# Patient Record
Sex: Female | Born: 1975
Health system: Southern US, Community
[De-identification: ages and names within clinical notes are randomized; demographics above are authoritative.]

## PROBLEM LIST (undated history)

## (undated) ENCOUNTER — Inpatient Hospital Stay (HOSPITAL_COMMUNITY): Payer: Self-pay

## (undated) DIAGNOSIS — O24419 Gestational diabetes mellitus in pregnancy, unspecified control: Secondary | ICD-10-CM

---

## 2000-05-01 ENCOUNTER — Encounter: Payer: Self-pay | Admitting: Emergency Medicine

## 2000-05-01 ENCOUNTER — Emergency Department (HOSPITAL_COMMUNITY): Admission: EM | Admit: 2000-05-01 | Discharge: 2000-05-01 | Payer: Self-pay | Admitting: Emergency Medicine

## 2000-06-10 ENCOUNTER — Emergency Department (HOSPITAL_COMMUNITY): Admission: EM | Admit: 2000-06-10 | Discharge: 2000-06-10 | Payer: Self-pay | Admitting: Emergency Medicine

## 2005-01-13 ENCOUNTER — Inpatient Hospital Stay (HOSPITAL_COMMUNITY): Admission: AD | Admit: 2005-01-13 | Discharge: 2005-01-13 | Payer: Self-pay | Admitting: *Deleted

## 2005-01-23 ENCOUNTER — Inpatient Hospital Stay (HOSPITAL_COMMUNITY): Admission: AD | Admit: 2005-01-23 | Discharge: 2005-01-23 | Payer: Self-pay | Admitting: *Deleted

## 2005-01-27 ENCOUNTER — Inpatient Hospital Stay (HOSPITAL_COMMUNITY): Admission: AD | Admit: 2005-01-27 | Discharge: 2005-01-27 | Payer: Self-pay | Admitting: *Deleted

## 2005-03-18 ENCOUNTER — Ambulatory Visit (HOSPITAL_COMMUNITY): Admission: RE | Admit: 2005-03-18 | Discharge: 2005-03-18 | Payer: Self-pay | Admitting: *Deleted

## 2005-03-27 ENCOUNTER — Ambulatory Visit (HOSPITAL_COMMUNITY): Admission: RE | Admit: 2005-03-27 | Discharge: 2005-03-27 | Payer: Self-pay | Admitting: Family Medicine

## 2005-04-12 ENCOUNTER — Ambulatory Visit: Payer: Self-pay | Admitting: Obstetrics and Gynecology

## 2005-04-12 ENCOUNTER — Inpatient Hospital Stay (HOSPITAL_COMMUNITY): Admission: AD | Admit: 2005-04-12 | Discharge: 2005-04-12 | Payer: Self-pay | Admitting: Obstetrics and Gynecology

## 2005-08-08 ENCOUNTER — Inpatient Hospital Stay (HOSPITAL_COMMUNITY): Admission: AD | Admit: 2005-08-08 | Discharge: 2005-08-09 | Payer: Self-pay | Admitting: Family Medicine

## 2005-08-08 ENCOUNTER — Ambulatory Visit: Payer: Self-pay | Admitting: Certified Nurse Midwife

## 2005-08-13 ENCOUNTER — Ambulatory Visit: Payer: Self-pay | Admitting: Family Medicine

## 2005-08-13 ENCOUNTER — Inpatient Hospital Stay (HOSPITAL_COMMUNITY): Admission: AD | Admit: 2005-08-13 | Discharge: 2005-08-15 | Payer: Self-pay | Admitting: Obstetrics and Gynecology

## 2005-08-16 ENCOUNTER — Inpatient Hospital Stay (HOSPITAL_COMMUNITY): Admission: AD | Admit: 2005-08-16 | Discharge: 2005-08-16 | Payer: Self-pay | Admitting: Obstetrics & Gynecology

## 2005-08-18 ENCOUNTER — Inpatient Hospital Stay (HOSPITAL_COMMUNITY): Admission: AD | Admit: 2005-08-18 | Discharge: 2005-08-18 | Payer: Self-pay | Admitting: *Deleted

## 2005-08-18 ENCOUNTER — Ambulatory Visit: Payer: Self-pay | Admitting: Obstetrics and Gynecology

## 2005-08-20 ENCOUNTER — Ambulatory Visit: Payer: Self-pay | Admitting: Obstetrics and Gynecology

## 2005-08-22 ENCOUNTER — Ambulatory Visit: Payer: Self-pay | Admitting: *Deleted

## 2005-08-25 ENCOUNTER — Ambulatory Visit: Payer: Self-pay | Admitting: Obstetrics and Gynecology

## 2005-08-25 ENCOUNTER — Inpatient Hospital Stay (HOSPITAL_COMMUNITY): Admission: AD | Admit: 2005-08-25 | Discharge: 2005-08-28 | Payer: Self-pay | Admitting: Family Medicine

## 2005-08-26 ENCOUNTER — Encounter (INDEPENDENT_AMBULATORY_CARE_PROVIDER_SITE_OTHER): Payer: Self-pay | Admitting: Specialist

## 2005-09-17 ENCOUNTER — Ambulatory Visit: Payer: Self-pay | Admitting: Family Medicine

## 2005-09-17 ENCOUNTER — Inpatient Hospital Stay (HOSPITAL_COMMUNITY): Admission: AD | Admit: 2005-09-17 | Discharge: 2005-09-17 | Payer: Self-pay | Admitting: Obstetrics and Gynecology

## 2007-06-09 ENCOUNTER — Inpatient Hospital Stay (HOSPITAL_COMMUNITY): Admission: AD | Admit: 2007-06-09 | Discharge: 2007-06-10 | Payer: Self-pay | Admitting: Obstetrics and Gynecology

## 2007-06-10 ENCOUNTER — Inpatient Hospital Stay (HOSPITAL_COMMUNITY): Admission: AD | Admit: 2007-06-10 | Discharge: 2007-06-13 | Payer: Self-pay | Admitting: Obstetrics & Gynecology

## 2008-05-05 ENCOUNTER — Emergency Department (HOSPITAL_COMMUNITY): Admission: EM | Admit: 2008-05-05 | Discharge: 2008-05-05 | Payer: Self-pay | Admitting: Emergency Medicine

## 2008-11-17 ENCOUNTER — Inpatient Hospital Stay (HOSPITAL_COMMUNITY): Admission: AD | Admit: 2008-11-17 | Discharge: 2008-11-17 | Payer: Self-pay | Admitting: Obstetrics & Gynecology

## 2008-11-22 ENCOUNTER — Inpatient Hospital Stay (HOSPITAL_COMMUNITY): Admission: AD | Admit: 2008-11-22 | Discharge: 2008-11-22 | Payer: Self-pay | Admitting: Obstetrics and Gynecology

## 2008-12-11 ENCOUNTER — Other Ambulatory Visit: Admission: RE | Admit: 2008-12-11 | Discharge: 2008-12-11 | Payer: Self-pay | Admitting: Obstetrics and Gynecology

## 2009-06-28 ENCOUNTER — Inpatient Hospital Stay (HOSPITAL_COMMUNITY): Admission: AD | Admit: 2009-06-28 | Discharge: 2009-06-28 | Payer: Self-pay | Admitting: Obstetrics and Gynecology

## 2009-07-04 ENCOUNTER — Inpatient Hospital Stay (HOSPITAL_COMMUNITY): Admission: AD | Admit: 2009-07-04 | Discharge: 2009-07-06 | Payer: Self-pay | Admitting: Obstetrics and Gynecology

## 2011-03-01 LAB — CBC
HCT: 25.3 % — ABNORMAL LOW (ref 36.0–46.0)
HCT: 27 % — ABNORMAL LOW (ref 36.0–46.0)
HCT: 28.4 % — ABNORMAL LOW (ref 36.0–46.0)
Hemoglobin: 8.7 g/dL — ABNORMAL LOW (ref 12.0–15.0)
Hemoglobin: 9.4 g/dL — ABNORMAL LOW (ref 12.0–15.0)
Hemoglobin: 9.9 g/dL — ABNORMAL LOW (ref 12.0–15.0)
MCHC: 34.5 g/dL (ref 30.0–36.0)
MCHC: 34.7 g/dL (ref 30.0–36.0)
MCHC: 34.8 g/dL (ref 30.0–36.0)
MCV: 85.5 fL (ref 78.0–100.0)
MCV: 85.9 fL (ref 78.0–100.0)
MCV: 86.2 fL (ref 78.0–100.0)
Platelets: 105 10*3/uL — ABNORMAL LOW (ref 150–400)
Platelets: 95 10*3/uL — ABNORMAL LOW (ref 150–400)
Platelets: 95 10*3/uL — ABNORMAL LOW (ref 150–400)
RBC: 2.95 MIL/uL — ABNORMAL LOW (ref 3.87–5.11)
RBC: 3.15 MIL/uL — ABNORMAL LOW (ref 3.87–5.11)
RBC: 3.3 MIL/uL — ABNORMAL LOW (ref 3.87–5.11)
RDW: 15.7 % — ABNORMAL HIGH (ref 11.5–15.5)
RDW: 16.1 % — ABNORMAL HIGH (ref 11.5–15.5)
RDW: 16.3 % — ABNORMAL HIGH (ref 11.5–15.5)
WBC: 6 10*3/uL (ref 4.0–10.5)
WBC: 6.6 10*3/uL (ref 4.0–10.5)
WBC: 7.7 10*3/uL (ref 4.0–10.5)

## 2011-03-01 LAB — RPR: RPR Ser Ql: NONREACTIVE

## 2011-04-11 NOTE — Discharge Summary (Signed)
NAMESHAVELLE, RUNKEL        ACCOUNT NO.:  [id number]   MEDICAL RECORD NO.:  [id number]          PATIENT TYPE:  INP   LOCATION:  9176                          FACILITY:  [facility]   PHYSICIAN:  Phil D. Okey Dupre, M.D.     DATE OF BIRTH:  [birth year]   DATE OF ADMISSION:  08/13/2005  DATE OF DISCHARGE:  08/15/2005                                 DISCHARGE SUMMARY   ADMISSION DIAGNOSES:  Intrauterine pregnancy at 39-1/7 weeks with pregnancy-  induced hypertension and headache.   DISCHARGE DIAGNOSES:  1.  Intrauterine pregnancy at 39-3/7 weeks.  2.  Normal blood pressures.  3.  Failed induction of labor.   DISCHARGE MEDICATIONS:  1.  Ambien 10 mg p.o. at night as needed for insomnia.  2.  Phenergan 12.5 mg p.o. as needed for nausea.   HISTORY OF PRESENT ILLNESS:  The patient is a 35 year old, gravida 1, para  0, at 39-1/7 weeks who presented with headache and blood pressure of  138/103.  The patient was admitted for induction of labor and to obtain  preeclampsia studies.   HOSPITAL COURSE:  The patient was admitted.  Pregnancy-induced hypertension  labs were drawn which were within normal limits.  The patient's blood  pressure improved without medication and her headache improved as well.  Her  blood pressure remained normal therapy this day.  The patient was also  started for induction of labor with Cytotec followed by Pitocin.  The  patient did not change so after a break, she was restarted on Cytotec and  then Cervidil.  The patient again did not make any progression towards  labor.  Repeat PIH labs were normal.  The patient's blood pressure remained  normal as well.  The patient denied any headaches, blurry vision or right  upper quadrant pain at the time of discharge.  As a result induction was  stopped and the patient was discharged home.   STUDIES:  Fetal ultrasound 58th to 75th percentile for weight. AFI was 12.3.  Cephalic presentation.  PIH labs:  LDH was 132, uric 4.4,  creatinine 0.6,  hemoglobin 9.8, hematocrit 29.1, platelets 153.  A 24-hour creatinine  clearance was 134.  A 24-hour urine protein was 221 mg.   CONDITION ON DISCHARGE:  Stable.   DISCHARGE INSTRUCTIONS:  The patient is to follow up at [facility] on  Monday, September 25 at 10 a.m.  She is to have an NST at that time.  The  patient was also instructed to return her 24-hour urine which is in progress  to the maternity admission unit on Saturday so that those results can be  determined.  The patient was instructed to seek medical attention for any  headaches, visual changes or right upper quadrant pain.      Benn Moulder, M.D.    ______________________________  Javier Glazier. Okey Dupre, M.D.    MR/MEDQ  D:  08/15/2005  T:  08/16/2005  Job:  [id number]

## 2011-04-11 NOTE — Op Note (Signed)
[name], Elizabeth Stephens        ACCOUNT NO.:  [id number]   MEDICAL RECORD NO.:  [id number]          PATIENT TYPE:  INP   LOCATION:  9372                          FACILITY:  [facility]   PHYSICIAN:  Phil D. Okey Dupre, M.D.     DATE OF BIRTH:  02/12/1976   DATE OF PROCEDURE:  08/25/2005  DATE OF DISCHARGE:                                 OPERATIVE REPORT   PROCEDURE:  Vacuum-assisted vaginal delivery.   PREOPERATIVE DIAGNOSES:  1.  Term pregnancy.  2.  Vacuum-assisted uterine vaginal delivery.  3.  Fetal exhaustion.  4.  Preeclampsia.   REASON FOR DELIVERY:  After more than two-hour second stage of the baby   Dictation ended at this point.           ______________________________  Javier Glazier. Okey Dupre, M.D.     PDR/MEDQ  D:  08/26/2005  T:  08/26/2005  Job:  [id number]

## 2011-04-11 NOTE — Op Note (Signed)
NAMEJALAIYAH, Elizabeth Stephens        ACCOUNT NO.:  [id number]   MEDICAL RECORD NO.:  [id number]          PATIENT TYPE:  INP   LOCATION:  9372                          FACILITY:  [facility]   PHYSICIAN:  Phil D. Okey Dupre, M.D.     DATE OF BIRTH:  1976/05/29   DATE OF PROCEDURE:  08/26/2005  DATE OF DISCHARGE:                                 OPERATIVE REPORT   PROCEDURE:  Vacuum-assisted vaginal delivery.   PREOPERATIVE DIAGNOSES:  1.  Preeclampsia.  2.  Maternal exhaustion.   POSTOPERATIVE DIAGNOSES:  1.  Preeclampsia.  2.  Maternal exhaustion.  3.  Uterine atony.   SURGEON:  Javier Glazier. Okey Dupre, M.D.   ANESTHESIA:  Epidural.   ESTIMATED BLOOD LOSS:  700 mL.   POSTOPERATIVE CONDITION:  Satisfactory.   REASON FOR VACUUM DELIVERY:  The patient after more than a two-hour second  stage of labor after induction for preeclampsia and the vertex at the  introitus, the patient unable to push any longer.  The vertex in LOA  presentation.  The vacuum suction was applied to the occiput and with  contractions after two pulls, the baby delivered easily.  It was a female  with Apgars of 8/9.  The cord was doubly clamped.  The baby's airway was  suctioned with the bulb suction, divided and the baby taken over to the  isolette.  The placenta spontaneously removed; however, a significant amount  of bleeding with her uterine atony ensued.  The uterus was explored, the  cervix was examined.  The only laceration was a second degree perineal  laceration, uterus was massaged and Hemabate given, at which time the uterus  firmed up quite nicely.  The laceration was closed as a usual episiotomy  would be closed with continuous running 2-0 chromic catgut suture.  The  vagina was examined for any sponges after completion of the episiotomy  repair,  and no bleeding had been __________.  Post delivery the patient was  satisfactory.           ______________________________  Javier Glazier Okey Dupre, M.D.     PDR/MEDQ  D:   08/26/2005  T:  08/26/2005  Job:  [id number]

## 2011-08-21 LAB — POCT PREGNANCY, URINE
Operator id: 282201
Preg Test, Ur: NEGATIVE

## 2011-08-21 LAB — CBC
HCT: 35.4 — ABNORMAL LOW
HCT: 36.8
Hemoglobin: 12.3
Hemoglobin: 12.4
MCHC: 33.6
MCHC: 34.8
MCV: 84.6
MCV: 89.3
Platelets: 230
Platelets: 236
RBC: 3.97
RBC: 4.35
RDW: 12.3
RDW: 13.8
WBC: 8.6
WBC: 9.2

## 2011-08-21 LAB — DIFFERENTIAL
Basophils Absolute: 0
Basophils Absolute: 0
Basophils Relative: 0
Basophils Relative: 1
Eosinophils Absolute: 0.1
Eosinophils Absolute: 0.1
Eosinophils Relative: 1
Eosinophils Relative: 1
Lymphocytes Relative: 32
Lymphocytes Relative: 40
Lymphs Abs: 3
Lymphs Abs: 3.4
Monocytes Absolute: 0.6
Monocytes Absolute: 0.7
Monocytes Relative: 7
Monocytes Relative: 8
Neutro Abs: 4.3
Neutro Abs: 5.5
Neutrophils Relative %: 50
Neutrophils Relative %: 59

## 2011-08-21 LAB — COMPREHENSIVE METABOLIC PANEL
ALT: 26
AST: 24
Albumin: 3.7
Alkaline Phosphatase: 98
BUN: 12
CO2: 28
Calcium: 8.7
Chloride: 102
Creatinine, Ser: 0.62
GFR calc Af Amer: >60
GFR calc non Af Amer: >60
Glucose, Bld: 100 — ABNORMAL HIGH
Potassium: 3.7
Sodium: 136
Total Bilirubin: 0.4
Total Protein: 6.6

## 2011-08-21 LAB — URINE CULTURE: Colony Count: 25000

## 2011-08-21 LAB — POCT CARDIAC MARKERS
CKMB, poc: <1 — ABNORMAL LOW
CKMB, poc: <1 — ABNORMAL LOW
CKMB, poc: <1 — ABNORMAL LOW
Myoglobin, poc: 25.4
Myoglobin, poc: 27
Myoglobin, poc: 31.3
Operator id: 133351
Operator id: 161631
Operator id: 282201
Troponin i, poc: 0.14 — ABNORMAL HIGH
Troponin i, poc: <0.05
Troponin i, poc: <0.05

## 2011-08-21 LAB — POCT I-STAT, CHEM 8
BUN: 13
Calcium, Ion: 1.11 — ABNORMAL LOW
Chloride: 101
Creatinine, Ser: 0.8
Glucose, Bld: 108 — ABNORMAL HIGH
HCT: 38
Hemoglobin: 12.9
Potassium: 3.8
Sodium: 137
TCO2: 27

## 2011-08-21 LAB — URINALYSIS, ROUTINE W REFLEX MICROSCOPIC
Bilirubin Urine: NEGATIVE
Glucose, UA: NEGATIVE
Hgb urine dipstick: NEGATIVE
Ketones, ur: NEGATIVE
Nitrite: NEGATIVE
Protein, ur: NEGATIVE
Specific Gravity, Urine: 1.018
Urobilinogen, UA: 0.2
pH: 6.5

## 2011-08-21 LAB — LIPASE, BLOOD: Lipase: 40

## 2011-08-29 LAB — URINALYSIS, ROUTINE W REFLEX MICROSCOPIC
Bilirubin Urine: NEGATIVE
Glucose, UA: NEGATIVE mg/dL
Hgb urine dipstick: NEGATIVE
Ketones, ur: NEGATIVE mg/dL
Nitrite: NEGATIVE
Protein, ur: NEGATIVE mg/dL
Specific Gravity, Urine: >1.03 — ABNORMAL HIGH (ref 1.005–1.030)
Urobilinogen, UA: 0.2 mg/dL (ref 0.0–1.0)
pH: 6 (ref 5.0–8.0)

## 2011-08-29 LAB — BASIC METABOLIC PANEL
BUN: 5 mg/dL — ABNORMAL LOW (ref 6–23)
CO2: 24 mEq/L (ref 19–32)
Calcium: 9.2 mg/dL (ref 8.4–10.5)
Chloride: 100 mEq/L (ref 96–112)
Creatinine, Ser: 0.45 mg/dL (ref 0.4–1.2)
GFR calc Af Amer: >60 mL/min (ref >60)
GFR calc non Af Amer: >60 mL/min (ref >60)
Glucose, Bld: 110 mg/dL — ABNORMAL HIGH (ref 70–99)
Potassium: 4 mEq/L (ref 3.5–5.1)
Sodium: 132 mEq/L — ABNORMAL LOW (ref 135–145)

## 2011-08-29 LAB — CBC
HCT: 33.2 % — ABNORMAL LOW (ref 36.0–46.0)
Hemoglobin: 11.1 g/dL — ABNORMAL LOW (ref 12.0–15.0)
MCHC: 33.5 g/dL (ref 30.0–36.0)
MCV: 85.2 fL (ref 78.0–100.0)
Platelets: 201 10*3/uL (ref 150–400)
RBC: 3.9 MIL/uL (ref 3.87–5.11)
RDW: 14.4 % (ref 11.5–15.5)
WBC: 7.1 10*3/uL (ref 4.0–10.5)

## 2011-08-29 LAB — POCT PREGNANCY, URINE: Preg Test, Ur: POSITIVE

## 2011-08-29 LAB — URINE MICROSCOPIC-ADD ON

## 2011-08-29 LAB — URINE CULTURE: Colony Count: >=100000

## 2011-09-08 LAB — CBC
HCT: 31.4 — ABNORMAL LOW
HCT: 32 — ABNORMAL LOW
HCT: 32.7 — ABNORMAL LOW
Hemoglobin: 10.5 — ABNORMAL LOW
Hemoglobin: 10.7 — ABNORMAL LOW
Hemoglobin: 10.8 — ABNORMAL LOW
MCHC: 32.7
MCHC: 33.3
MCHC: 33.8
MCV: 81.9
MCV: 83.1
MCV: 83.7
Platelets: 131 — ABNORMAL LOW
Platelets: 131 — ABNORMAL LOW
Platelets: 148 — ABNORMAL LOW
RBC: 3.78 — ABNORMAL LOW
RBC: 3.9
RBC: 3.9
RDW: 15.5 — ABNORMAL HIGH
RDW: 15.6 — ABNORMAL HIGH
RDW: 15.7 — ABNORMAL HIGH
WBC: 6.1
WBC: 6.6
WBC: 8.6

## 2011-09-08 LAB — COMPREHENSIVE METABOLIC PANEL
ALT: 22
AST: 36
Albumin: 2.4 — ABNORMAL LOW
Alkaline Phosphatase: 153 — ABNORMAL HIGH
BUN: 3 — ABNORMAL LOW
CO2: 25
Calcium: 8.5
Chloride: 106
Creatinine, Ser: 0.47
GFR calc Af Amer: >60
GFR calc non Af Amer: >60
Glucose, Bld: 85
Potassium: 3.3 — ABNORMAL LOW
Sodium: 137
Total Bilirubin: 0.4
Total Protein: 5.7 — ABNORMAL LOW

## 2011-09-08 LAB — URINE MICROSCOPIC-ADD ON

## 2011-09-08 LAB — DIFFERENTIAL
Basophils Absolute: 0.1
Basophils Relative: 1
Eosinophils Absolute: 0.1
Eosinophils Relative: 2
Lymphocytes Relative: 20
Lymphs Abs: 1.3
Monocytes Absolute: 0.3
Monocytes Relative: 5
Neutro Abs: 4.8
Neutrophils Relative %: 72

## 2011-09-08 LAB — URINALYSIS, ROUTINE W REFLEX MICROSCOPIC
Bilirubin Urine: NEGATIVE
Glucose, UA: NEGATIVE
Ketones, ur: NEGATIVE
Leukocytes, UA: NEGATIVE
Nitrite: NEGATIVE
Protein, ur: NEGATIVE
Specific Gravity, Urine: 1.01
Urobilinogen, UA: 0.2
pH: 6.5

## 2011-09-08 LAB — URIC ACID: Uric Acid, Serum: 3.6

## 2011-09-08 LAB — LACTATE DEHYDROGENASE: LDH: 171

## 2011-09-08 LAB — RPR: RPR Ser Ql: NONREACTIVE

## 2011-11-08 ENCOUNTER — Emergency Department (HOSPITAL_COMMUNITY)
Admission: EM | Admit: 2011-11-08 | Discharge: 2011-11-08 | Disposition: A | Discharge status: Home / Self Care | Payer: PRIVATE HEALTH INSURANCE | Attending: Emergency Medicine | Admitting: Emergency Medicine

## 2011-11-08 ENCOUNTER — Encounter: Payer: Self-pay | Admitting: Emergency Medicine

## 2011-11-08 DIAGNOSIS — R05 Cough: Secondary | ICD-10-CM | POA: Insufficient documentation

## 2011-11-08 DIAGNOSIS — IMO0001 Reserved for inherently not codable concepts without codable children: Secondary | ICD-10-CM | POA: Insufficient documentation

## 2011-11-08 DIAGNOSIS — H5789 Other specified disorders of eye and adnexa: Secondary | ICD-10-CM | POA: Insufficient documentation

## 2011-11-08 DIAGNOSIS — J111 Influenza due to unidentified influenza virus with other respiratory manifestations: Secondary | ICD-10-CM | POA: Insufficient documentation

## 2011-11-08 DIAGNOSIS — J3489 Other specified disorders of nose and nasal sinuses: Secondary | ICD-10-CM | POA: Insufficient documentation

## 2011-11-08 DIAGNOSIS — R059 Cough, unspecified: Secondary | ICD-10-CM | POA: Insufficient documentation

## 2011-11-08 DIAGNOSIS — H109 Unspecified conjunctivitis: Secondary | ICD-10-CM

## 2011-11-08 MED ORDER — POLYMYXIN B-TRIMETHOPRIM 10000-0.1 UNIT/ML-% OP SOLN: 1.0000 [drp] | OPHTHALMIC | Status: AC

## 2011-11-08 MED ORDER — NAPROXEN 500 MG PO TABS: 500.0000 mg | ORAL_TABLET | Freq: Two times a day (BID) | ORAL | Status: AC

## 2011-11-08 MED ORDER — BENZONATATE 100 MG PO CAPS: 100.0000 mg | ORAL_CAPSULE | Freq: Three times a day (TID) | ORAL | Status: AC

## 2011-11-08 NOTE — ED Notes (Signed)
PT. REPORTS PRODUCTIVE COUGH FOR 3 DAYS WITH BODY ACHES AND RIGHT EYE REDDNESS , SEEN HERE - PRESCRIBED WITH TAMIFLU / COUGH MEDICATION  WITH NO IMPROVEMENT .

## 2011-11-08 NOTE — ED Provider Notes (Signed)
History     CSN: [id number] Arrival date & time: 11/08/2011  6:16 AM   First MD Initiated Contact with Patient 11/08/11 [phone]      Chief Complaint  Patient presents with  . Cough    (Consider location/radiation/quality/duration/timing/severity/associated sxs/prior treatment) HPI Comments: Pt was seen at an urgent care 4 days ago and was given an RX for tamiflu.  Pt is still coughing.  She has not been taking any other medications.  She also noticed that her eye was red this am.  Patient is a 35 y.o. female presenting with cough. The history is provided by the patient.  Cough This is a new problem. The current episode started more than 2 days ago. The problem occurs every few minutes. The cough is non-productive. There has been no fever. Associated symptoms include rhinorrhea, sore throat, myalgias and eye redness. Treatments tried: tamiflu. The treatment provided mild relief. She is not a smoker.    History reviewed. No pertinent past medical history.  History reviewed. No pertinent past surgical history.  No family history on file.  History  Substance Use Topics  . Smoking status: Never Smoker   . Smokeless tobacco: Not on file  . Alcohol Use: No    OB History    Grav Para Term Preterm Abortions TAB SAB Ect Mult Living                  Review of Systems  HENT: Positive for sore throat and rhinorrhea.   Eyes: Positive for redness.  Respiratory: Positive for cough.   Musculoskeletal: Positive for myalgias.  All other systems reviewed and are negative.    Allergies  Review of patient's allergies indicates no known allergies.  Home Medications   Current Outpatient Rx  Name Route Sig Dispense Refill  . GUAIFENESIN ER 600 MG PO TB12 Oral Take 1,200 mg by mouth 2 (two) times daily.      . OSELTAMIVIR PHOSPHATE 75 MG PO CAPS Oral Take 75 mg by mouth 2 (two) times daily.        BP 130/91  Pulse 99  Temp(Src) 98.7 F (37.1 C) (Oral)  Resp 17  SpO2 100%  LMP  10/07/2011  Physical Exam  Nursing note and vitals reviewed. Constitutional: She appears well-developed and well-nourished. No distress.  HENT:  Head: Normocephalic and atraumatic.  Right Ear: External ear normal.  Left Ear: External ear normal.  Mouth/Throat: Oropharynx is clear and moist.  Eyes: Pupils are equal, round, and reactive to light. Right eye exhibits no discharge. Left eye exhibits no discharge. Right conjunctiva is injected. Left conjunctiva is not injected. No scleral icterus. Right eye exhibits normal extraocular motion. Left eye exhibits normal extraocular motion.  Neck: Neck supple. No tracheal deviation present.  Cardiovascular: Normal rate, regular rhythm and intact distal pulses.   Pulmonary/Chest: Effort normal and breath sounds normal. No stridor. No respiratory distress. She has no wheezes. She has no rales.  Abdominal: Soft. Bowel sounds are normal. She exhibits no distension. There is no tenderness. There is no rebound and no guarding.  Musculoskeletal: She exhibits no edema and no tenderness.  Neurological: She is alert. She has normal strength. No sensory deficit. Cranial nerve deficit:  no gross defecits noted. She exhibits normal muscle tone. She displays no seizure activity. Coordination normal.  Skin: Skin is warm and dry. No rash noted.  Psychiatric: She has a normal mood and affect.    ED Course  Procedures (including critical care time)  Labs Reviewed -  No data to display No results found.  Influenza    MDM  Pt with resolving influenza.  Lungs CTA.  No fever.  Appears to have mild conjunctivitis as well at this time.  Will rx supportive meds, tessalon and naprosyn.          Celene Kras, MD 11/08/11 [phone]

## 2011-11-08 NOTE — ED Notes (Signed)
Patient complaining of cough, sore throat, cold/flu symptoms, and right eye pain/redness (patient feels she has pink eye).  Patient was seen at Urgent Care several days ago for the same symptoms -- was given Tamiflu and cough medication; patient states that these medications have provided little relief for her.  Patient also had strep test done at Urgent Care; results negative.  Patient alert and oriented x4; PERRL present.  Will continue to monitor.

## 2012-05-14 ENCOUNTER — Other Ambulatory Visit (HOSPITAL_COMMUNITY)
Admission: RE | Admit: 2012-05-14 | Discharge: 2012-05-14 | Disposition: A | Discharge status: Home / Self Care | Payer: PRIVATE HEALTH INSURANCE | Source: Ambulatory Visit | Attending: Obstetrics and Gynecology | Admitting: Obstetrics and Gynecology

## 2012-05-14 ENCOUNTER — Other Ambulatory Visit: Payer: Self-pay | Admitting: Obstetrics and Gynecology

## 2012-05-14 DIAGNOSIS — Z1159 Encounter for screening for other viral diseases: Secondary | ICD-10-CM | POA: Insufficient documentation

## 2012-05-14 DIAGNOSIS — Z01419 Encounter for gynecological examination (general) (routine) without abnormal findings: Secondary | ICD-10-CM | POA: Insufficient documentation

## 2013-05-26 ENCOUNTER — Other Ambulatory Visit: Payer: Self-pay | Admitting: Obstetrics and Gynecology

## 2013-05-30 ENCOUNTER — Other Ambulatory Visit: Payer: Self-pay | Admitting: Obstetrics and Gynecology

## 2013-05-31 ENCOUNTER — Encounter (HOSPITAL_COMMUNITY): Payer: Self-pay | Admitting: Pharmacist

## 2013-06-01 ENCOUNTER — Ambulatory Visit (HOSPITAL_COMMUNITY): Payer: BC Managed Care – PPO | Admitting: Anesthesiology

## 2013-06-01 ENCOUNTER — Ambulatory Visit (HOSPITAL_COMMUNITY): Payer: BC Managed Care – PPO

## 2013-06-01 ENCOUNTER — Encounter (HOSPITAL_COMMUNITY)
Admission: RE | Disposition: A | Discharge status: Home / Self Care | Payer: Self-pay | Source: Ambulatory Visit | Attending: Obstetrics and Gynecology

## 2013-06-01 ENCOUNTER — Encounter (HOSPITAL_COMMUNITY): Payer: Self-pay | Admitting: Anesthesiology

## 2013-06-01 ENCOUNTER — Ambulatory Visit (HOSPITAL_COMMUNITY)
Admission: RE | Admit: 2013-06-01 | Discharge: 2013-06-01 | Disposition: A | Discharge status: Home / Self Care | Payer: BC Managed Care – PPO | Source: Ambulatory Visit | Attending: Obstetrics and Gynecology | Admitting: Obstetrics and Gynecology

## 2013-06-01 DIAGNOSIS — O021 Missed abortion: Secondary | ICD-10-CM | POA: Insufficient documentation

## 2013-06-01 HISTORY — PX: DILATION AND EVACUATION: SHX1459

## 2013-06-01 LAB — CBC
HCT: 34.1 % — ABNORMAL LOW (ref 36.0–46.0)
Hemoglobin: 11.6 g/dL — ABNORMAL LOW (ref 12.0–15.0)
MCH: 28 pg (ref 26.0–34.0)
MCHC: 34 g/dL (ref 30.0–36.0)
MCV: 82.4 fL (ref 78.0–100.0)
Platelets: 196 10*3/uL (ref 150–400)
RBC: 4.14 MIL/uL (ref 3.87–5.11)
RDW: 13.5 % (ref 11.5–15.5)
WBC: 4.9 10*3/uL (ref 4.0–10.5)

## 2013-06-01 SURGERY — DILATION AND EVACUATION, UTERUS
Anesthesia: Monitor Anesthesia Care | Site: Vagina | Wound class: Clean Contaminated

## 2013-06-01 MED ORDER — ONDANSETRON HCL 4 MG/2ML IJ SOLN
INTRAMUSCULAR | Status: AC
  Filled 2013-06-01: qty 2

## 2013-06-01 MED ORDER — LACTATED RINGERS IV SOLN
INTRAVENOUS | Status: DC
  Administered 2013-06-01: 11:00:00 via INTRAVENOUS

## 2013-06-01 MED ORDER — PROPOFOL 10 MG/ML IV EMUL
INTRAVENOUS | Status: DC | PRN
  Administered 2013-06-01: 30 mg via INTRAVENOUS
  Administered 2013-06-01 (×5): 20 mg via INTRAVENOUS
  Administered 2013-06-01: 10 mg via INTRAVENOUS
  Administered 2013-06-01: 40 mg via INTRAVENOUS

## 2013-06-01 MED ORDER — PROPOFOL 10 MG/ML IV EMUL
INTRAVENOUS | Status: AC
  Filled 2013-06-01: qty 20

## 2013-06-01 MED ORDER — KETOROLAC TROMETHAMINE 30 MG/ML IJ SOLN
INTRAMUSCULAR | Status: AC
  Filled 2013-06-01: qty 1

## 2013-06-01 MED ORDER — MIDAZOLAM HCL 5 MG/5ML IJ SOLN
INTRAMUSCULAR | Status: DC | PRN
  Administered 2013-06-01: 2 mg via INTRAVENOUS

## 2013-06-01 MED ORDER — MIDAZOLAM HCL 2 MG/2ML IJ SOLN
INTRAMUSCULAR | Status: AC
  Filled 2013-06-01: qty 2

## 2013-06-01 MED ORDER — BUPIVACAINE HCL (PF) 0.25 % IJ SOLN
INTRAMUSCULAR | Status: AC
  Filled 2013-06-01: qty 30

## 2013-06-01 MED ORDER — 0.9 % SODIUM CHLORIDE (POUR BTL) OPTIME
TOPICAL | Status: DC | PRN
  Administered 2013-06-01: 1000 mL

## 2013-06-01 MED ORDER — FENTANYL CITRATE 0.05 MG/ML IJ SOLN
INTRAMUSCULAR | Status: AC
  Filled 2013-06-01: qty 2

## 2013-06-01 MED ORDER — KETOROLAC TROMETHAMINE 30 MG/ML IJ SOLN
INTRAMUSCULAR | Status: DC | PRN
  Administered 2013-06-01: 30 mg via INTRAVENOUS

## 2013-06-01 MED ORDER — IBUPROFEN 200 MG PO TABS: 800.0000 mg | ORAL_TABLET | Freq: Three times a day (TID) | ORAL | Status: DC | PRN

## 2013-06-01 MED ORDER — LIDOCAINE HCL (CARDIAC) 20 MG/ML IV SOLN
INTRAVENOUS | Status: AC
  Filled 2013-06-01: qty 5

## 2013-06-01 MED ORDER — LIDOCAINE HCL (CARDIAC) 20 MG/ML IV SOLN
INTRAVENOUS | Status: DC | PRN
  Administered 2013-06-01: 30 mg via INTRAVENOUS

## 2013-06-01 MED ORDER — BUPIVACAINE HCL (PF) 0.25 % IJ SOLN
INTRAMUSCULAR | Status: DC | PRN
  Administered 2013-06-01: 20 mL

## 2013-06-01 MED ORDER — DOXYCYCLINE HYCLATE 50 MG PO CAPS: 100.0000 mg | ORAL_CAPSULE | Freq: Every day | ORAL | Status: DC

## 2013-06-01 MED ORDER — FENTANYL CITRATE 0.05 MG/ML IJ SOLN
INTRAMUSCULAR | Status: DC | PRN
  Administered 2013-06-01 (×2): 50 ug via INTRAVENOUS

## 2013-06-01 MED ORDER — ONDANSETRON HCL 4 MG/2ML IJ SOLN
INTRAMUSCULAR | Status: DC | PRN
  Administered 2013-06-01: 10 mg via INTRAVENOUS
  Administered 2013-06-01: 4 mg via INTRAVENOUS

## 2013-06-01 SURGICAL SUPPLY — 23 items
CATH ROBINSON RED A/P 16FR (CATHETERS) ×2 IMPLANT
CLOTH BEACON ORANGE TIMEOUT ST (SAFETY) ×2 IMPLANT
DECANTER SPIKE VIAL GLASS SM (MISCELLANEOUS) ×2 IMPLANT
GLOVE BIOGEL M 6.5 STRL (GLOVE) ×4 IMPLANT
GLOVE BIOGEL PI IND STRL 6.5 (GLOVE) ×1 IMPLANT
GLOVE BIOGEL PI INDICATOR 6.5 (GLOVE) ×1
GLOVE SURG SS PI 7.0 STRL IVOR (GLOVE) ×1 IMPLANT
GOWN PREVENTION PLUS XLARGE (GOWN DISPOSABLE) ×2 IMPLANT
GOWN STRL REIN XL XLG (GOWN DISPOSABLE) ×4 IMPLANT
KIT BERKELEY 1ST TRIMESTER 3/8 (MISCELLANEOUS) ×2 IMPLANT
NDL SPNL 22GX3.5 QUINCKE BK (NEEDLE) ×1 IMPLANT
NEEDLE SPNL 22GX3.5 QUINCKE BK (NEEDLE) ×2 IMPLANT
NS IRRIG 1000ML POUR BTL (IV SOLUTION) ×2 IMPLANT
PACK VAGINAL MINOR WOMEN LF (CUSTOM PROCEDURE TRAY) ×2 IMPLANT
PAD OB MATERNITY 4.3X12.25 (PERSONAL CARE ITEMS) ×2 IMPLANT
PAD PREP 24X48 CUFFED NSTRL (MISCELLANEOUS) ×2 IMPLANT
SET BERKELEY SUCTION TUBING (SUCTIONS) ×2 IMPLANT
SYR CONTROL 10ML LL (SYRINGE) ×2 IMPLANT
TOWEL OR 17X24 6PK STRL BLUE (TOWEL DISPOSABLE) ×4 IMPLANT
VACURETTE 10 RIGID CVD (CANNULA) IMPLANT
VACURETTE 7MM CVD STRL WRAP (CANNULA) IMPLANT
VACURETTE 8 RIGID CVD (CANNULA) IMPLANT
VACURETTE 9 RIGID CVD (CANNULA) ×1 IMPLANT

## 2013-06-01 NOTE — Op Note (Signed)
06/01/2013  1:37 PM  PATIENT:  Elizabeth Stephens  37 y.o. female  PRE-OPERATIVE DIAGNOSIS:  missed ab  POST-OPERATIVE DIAGNOSIS:  missed ab  PROCEDURE:  Procedure(s): DILATATION AND EVACUATION, ultrasound guided (N/A)  SURGEON:  Surgeon(s) and Role:    * Erine Phenix J. Richardson Dopp, MD - Primary  PHYSICIAN ASSISTANT:   ASSISTANTS: none   ANESTHESIA:   MAC  EBL:  Total I/O In: 900 [I.V.:900] Out: 5 [Blood:5]  BLOOD ADMINISTERED:none  DRAINS: none   LOCAL MEDICATIONS USED:  MARCAINE     SPECIMEN:  Source of Specimen:  products of conception   DISPOSITION OF SPECIMEN:  PATHOLOGY  COUNTS:  YES  TOURNIQUET:  * No tourniquets in log *  DICTATION: .Dragon Dictation  PLAN OF CARE: Discharge to home after PACU  PATIENT DISPOSITION:  PACU - hemodynamically stable.   Delay start of Pharmacological VTE agent (>24hrs) due to surgical blood loss or risk of bleeding: not applicable   Pt was taken to the operative room placed in the dorsal lithotomy position and time out was performed. She was prepped and draped in the usual sterile fashion. Speculum was placed. The anterior lip of the cervix was grasped with a single tooth tenaculum. 10 cc of 25% marcaine was injected at the 4 and 8 oclock position of the cervix. The uterus was sounded to 10 cm. A 9 mm suction currette was introduced and products of conception were removed without difficulty. This was performed under ultrasound guidance. The single tooth tenaculum was removed. Excellent hemostasis was noted.  All instruments were removed from the vagina. .. Sponge lap and needle counts were correct x 2.  Pt was awakened from anesthesia and taken to the recovery room in stable condition.

## 2013-06-01 NOTE — Transfer of Care (Signed)
Immediate Anesthesia Transfer of Care Note  Patient: Elizabeth Stephens  Procedure(s) Performed: Procedure(s): DILATATION AND EVACUATION, ultrasound guided (N/A)  Patient Location: PACU  Anesthesia Type:MAC  Level of Consciousness: awake, alert  and oriented  Airway & Oxygen Therapy: Patient Spontanous Breathing  Post-op Assessment: Report given to PACU RN and Post -op Vital signs reviewed and stable  Post vital signs: Reviewed and stable  Complications: No apparent anesthesia complications

## 2013-06-01 NOTE — H&P (Signed)
Date of Initial H&P:05/26/2013  History reviewed, patient examined, no change in status, stable for surgery.

## 2013-06-01 NOTE — Anesthesia Preprocedure Evaluation (Addendum)
Anesthesia Evaluation  Patient identified by MRN, date of birth, ID band Patient awake    Reviewed: Allergy & Precautions, H&P , Patient's Chart, lab work & pertinent test results, reviewed documented beta blocker date and time   History of Anesthesia Complications Negative for: history of anesthetic complications  Airway Mallampati: II TM Distance: >3 FB Neck ROM: full    Dental no notable dental hx.    Pulmonary neg pulmonary ROS,  breath sounds clear to auscultation  Pulmonary exam normal       Cardiovascular Exercise Tolerance: Good negative cardio ROS  Rhythm:regular Rate:Normal     Neuro/Psych negative neurological ROS  negative psych ROS   GI/Hepatic negative GI ROS, Neg liver ROS,   Endo/Other  negative endocrine ROS  Renal/GU negative Renal ROS     Musculoskeletal   Abdominal   Peds  Hematology negative hematology ROS (+)   Anesthesia Other Findings   Reproductive/Obstetrics negative OB ROS                           Anesthesia Physical Anesthesia Plan  ASA: I  Anesthesia Plan: MAC   Post-op Pain Management:    Induction:   Airway Management Planned:   Additional Equipment:   Intra-op Plan:   Post-operative Plan:   Informed Consent: I have reviewed the patients History and Physical, chart, labs and discussed the procedure including the risks, benefits and alternatives for the proposed anesthesia with the patient or authorized representative who has indicated his/her understanding and acceptance.   Dental Advisory Given  Plan Discussed with: CRNA, Surgeon and Anesthesiologist  Anesthesia Plan Comments:         Anesthesia Quick Evaluation  

## 2013-06-01 NOTE — Anesthesia Postprocedure Evaluation (Signed)
  Anesthesia Post Note  Patient: Elizabeth Stephens  Procedure(s) Performed: Procedure(s) (LRB): DILATATION AND EVACUATION, ultrasound guided (N/A)  Anesthesia type: MAC  Patient location: PACU  Post pain: Pain level controlled  Post assessment: Post-op Vital signs reviewed  Last Vitals:  Filed Vitals:   06/01/13 1330  BP: 100/59  Pulse: 75  Temp:   Resp: 16    Post vital signs: Reviewed  Level of consciousness: sedated  Complications: No apparent anesthesia complications

## 2013-06-02 ENCOUNTER — Encounter (HOSPITAL_COMMUNITY): Payer: Self-pay | Admitting: Obstetrics and Gynecology

## 2014-03-29 ENCOUNTER — Emergency Department (HOSPITAL_COMMUNITY)
Admission: EM | Admit: 2014-03-29 | Discharge: 2014-03-29 | Disposition: A | Discharge status: Home / Self Care | Payer: Commercial Indemnity | Attending: Emergency Medicine | Admitting: Emergency Medicine

## 2014-03-29 ENCOUNTER — Encounter (HOSPITAL_COMMUNITY): Payer: Self-pay | Admitting: Emergency Medicine

## 2014-03-29 ENCOUNTER — Emergency Department (HOSPITAL_COMMUNITY): Payer: Commercial Indemnity

## 2014-03-29 DIAGNOSIS — Y92410 Unspecified street and highway as the place of occurrence of the external cause: Secondary | ICD-10-CM | POA: Insufficient documentation

## 2014-03-29 DIAGNOSIS — S6990XA Unspecified injury of unspecified wrist, hand and finger(s), initial encounter: Secondary | ICD-10-CM

## 2014-03-29 DIAGNOSIS — S59909A Unspecified injury of unspecified elbow, initial encounter: Secondary | ICD-10-CM | POA: Insufficient documentation

## 2014-03-29 DIAGNOSIS — Y9389 Activity, other specified: Secondary | ICD-10-CM | POA: Insufficient documentation

## 2014-03-29 DIAGNOSIS — S4980XA Other specified injuries of shoulder and upper arm, unspecified arm, initial encounter: Secondary | ICD-10-CM | POA: Insufficient documentation

## 2014-03-29 DIAGNOSIS — Z792 Long term (current) use of antibiotics: Secondary | ICD-10-CM | POA: Insufficient documentation

## 2014-03-29 DIAGNOSIS — S59919A Unspecified injury of unspecified forearm, initial encounter: Secondary | ICD-10-CM

## 2014-03-29 DIAGNOSIS — S46909A Unspecified injury of unspecified muscle, fascia and tendon at shoulder and upper arm level, unspecified arm, initial encounter: Secondary | ICD-10-CM | POA: Insufficient documentation

## 2014-03-29 DIAGNOSIS — M25512 Pain in left shoulder: Secondary | ICD-10-CM

## 2014-03-29 MED ORDER — HYDROCODONE-ACETAMINOPHEN 5-325 MG PO TABS
2.0000 | ORAL_TABLET | Freq: Once | ORAL | Status: AC
  Administered 2014-03-29: 2 via ORAL
  Filled 2014-03-29: qty 2

## 2014-03-29 MED ORDER — HYDROCODONE-ACETAMINOPHEN 5-325 MG PO TABS: 1.0000 | ORAL_TABLET | ORAL | Status: DC | PRN

## 2014-03-29 MED ORDER — MELOXICAM 7.5 MG PO TABS: 15.0000 mg | ORAL_TABLET | Freq: Every day | ORAL | Status: DC
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
## 2014-03-29 NOTE — Discharge Instructions (Signed)
Acromioclavicular Injuries °The acromioclavicular (AC) joint is the joint in the shoulder. There are many bands of tissue (ligaments) that surround the AC bones and joints. These bands of tissue can tear, which can lead to sprains and separations. The bones of the AC joint can also break (fracture).  °HOME CARE  °· Put ice on the injured area. °· Put ice in a plastic bag. °· Place a towel between your skin and the bag. °· Leave the ice on for 15-20 minutes, 03-04 times a day. °· Wear your sling as told by your doctor. Remove the sling before showering and bathing. Keep the shoulder in the same place as when the sling is on. Do not lift the arm. °· Gently tighten your figure-eight splint (if applied) every day. Tighten it enough to keep the shoulders held back. There should be room to place your finger between your body and the strap. Loosen the splint right away if you lose feeling (numbness) or have tingling in your hands. °· Only take medicine as told by your doctor. °· Keep all follow-up visits with your doctor. °GET HELP RIGHT AWAY IF:  °· Your medicine does not help your pain. °· You have more puffiness (swelling) or your bruising gets worse rather than better. °· You were unable to follow up as told by your doctor. °· You have tingling or lose even more feeling in your arm, forearm, or hand. °· Your arm is cold or pale. °· You have more pain in the hand, forearm, or fingers. °MAKE SURE YOU:  °· Understand these instructions. °· Will watch your condition. °· Will get help right away if you are not doing well or get worse. °Document Released: 04/30/2010 Document Revised: 02/02/2012 Document Reviewed: 04/30/2010 °[employer]® Patient Information ©2014 [employer]. ° °

## 2014-03-29 NOTE — Progress Notes (Signed)
Orthopedic Tech Progress Note Patient Details:  Elizabeth Stephens 05/21/1976 [id number]  Ortho Devices Type of Ortho Device: Arm sling Ortho Device/Splint Interventions: Application   Mickie BailJennifer Carol Cammer 03/29/2014, 11:17 AM

## 2014-03-31 NOTE — ED Provider Notes (Signed)
Medical screening examination/treatment/procedure(s) were performed by non-physician practitioner and as supervising physician I was immediately available for consultation/collaboration.   EKG Interpretation None        Moria Brophy M Maricela Kawahara, DO 03/31/14 2035 

## 2014-06-27 ENCOUNTER — Ambulatory Visit: Payer: Self-pay | Admitting: General Practice

## 2014-08-25 ENCOUNTER — Encounter (HOSPITAL_COMMUNITY): Payer: Self-pay | Admitting: Emergency Medicine

## 2014-08-25 ENCOUNTER — Emergency Department (HOSPITAL_COMMUNITY)
Admission: EM | Admit: 2014-08-25 | Discharge: 2014-08-25 | Disposition: A | Discharge status: Home / Self Care | Payer: 59 | Source: Home / Self Care | Attending: Family Medicine | Admitting: Family Medicine

## 2014-08-25 DIAGNOSIS — J020 Streptococcal pharyngitis: Secondary | ICD-10-CM

## 2014-08-25 LAB — POCT RAPID STREP A: Streptococcus, Group A Screen (Direct): NEGATIVE

## 2014-08-25 MED ORDER — AMOXICILLIN 500 MG PO CAPS: 500.0000 mg | ORAL_CAPSULE | Freq: Three times a day (TID) | ORAL | Status: DC

## 2014-08-25 NOTE — Discharge Instructions (Signed)
Drink lots of fluids, take all of medicine, use lozenges as needed.return if needed °

## 2014-08-25 NOTE — ED Provider Notes (Signed)
CSN: [id number]     Arrival date & time 08/25/14  1845 History   First MD Initiated Contact with Patient 08/25/14 1856     Chief Complaint  Patient presents with  . Sore Throat   (Consider location/radiation/quality/duration/timing/severity/associated sxs/prior Treatment) Patient is a 38 y.o. female presenting with pharyngitis. The history is provided by the patient.  Sore Throat This is a new problem. The current episode started yesterday. The problem has been gradually worsening. Pertinent negatives include no chest pain and no abdominal pain. The symptoms are aggravated by swallowing.    History reviewed. No pertinent past medical history. Past Surgical History  Procedure Laterality Date  . Dilation and evacuation N/A 06/01/2013    Procedure: DILATATION AND EVACUATION, ultrasound guided;  Surgeon: Dorien Chihuahuaara J. Richardson Doppole, MD;  Location: [facility];  Service: Gynecology;  Laterality: N/A;   No family history on file. History  Substance Use Topics  . Smoking status: Never Smoker   . Smokeless tobacco: Never Used  . Alcohol Use: No   OB History   Grav Para Term Preterm Abortions TAB SAB Ect Mult Living                 Review of Systems  Constitutional: Positive for fever.  HENT: Positive for sore throat. Negative for postnasal drip and rhinorrhea.   Respiratory: Negative.   Cardiovascular: Negative for chest pain.  Gastrointestinal: Negative for abdominal pain.    Allergies  Review of patient's allergies indicates no known allergies.  Home Medications   Prior to Admission medications   Medication Sig Start Date End Date Taking? Authorizing Provider  acetaminophen (TYLENOL) 325 MG tablet Take 650 mg by mouth every 6 (six) hours as needed for pain.    Historical Provider, MD  amoxicillin (AMOXIL) 500 MG capsule Take 1 capsule (500 mg total) by mouth 3 (three) times daily. 08/25/14   Linna HoffJames D Kindl, MD  doxycycline (VIBRAMYCIN) 50 MG capsule Take 2 capsules (100 mg total) by mouth daily.  06/01/13   Dorien Chihuahuaara J. Richardson Doppole, MD  HYDROcodone-acetaminophen (NORCO/VICODIN) 5-325 MG per tablet Take 1-2 tablets by mouth every 4 (four) hours as needed. 03/29/14   Junius FinnerErin O'Malley, PA-C  ibuprofen (ADVIL) 200 MG tablet Take 4 tablets (800 mg total) by mouth every 8 (eight) hours as needed for pain. 06/01/13   Dorien Chihuahuaara J. Richardson Doppole, MD  meloxicam (MOBIC) 7.5 MG tablet Take 2 tablets (15 mg total) by mouth daily. 03/29/14   Junius FinnerErin O'Malley, PA-C   LMP 08/16/2014 Physical Exam  Nursing note and vitals reviewed. Constitutional: She is oriented to person, place, and time. She appears well-developed and well-nourished.  HENT:  Head: Normocephalic.  Right Ear: External ear normal.  Left Ear: External ear normal.  Nose: Nose normal.  Mouth/Throat: Oropharyngeal exudate present.  Neck: Normal range of motion. Neck supple.  Cardiovascular: Normal heart sounds.   Pulmonary/Chest: Breath sounds normal.  Lymphadenopathy:    She has no cervical adenopathy.  Neurological: She is alert and oriented to person, place, and time.  Skin: Skin is warm and dry.    ED Course  Procedures (including critical care time) Labs Review Labs Reviewed  POCT RAPID STREP A ([facility] CARE ONLY)    Imaging Review No results found.   MDM   1. Pharyngitis, streptococcal, acute        Linna HoffJames D Kindl, MD 08/25/14 1921

## 2014-08-25 NOTE — ED Notes (Addendum)
Patient c/o sore throat onset yesterday. She reports difficulty swallowing and body aches. Has been using throat lozenges with only temporary relief. Patient is alert and oriented and in no acute distress.

## 2014-08-27 LAB — CULTURE, GROUP A STREP

## 2016-05-22 DIAGNOSIS — Z13220 Encounter for screening for lipoid disorders: Secondary | ICD-10-CM | POA: Diagnosis not present

## 2016-05-22 DIAGNOSIS — N926 Irregular menstruation, unspecified: Secondary | ICD-10-CM | POA: Diagnosis not present

## 2016-05-22 DIAGNOSIS — Z0000 Encounter for general adult medical examination without abnormal findings: Secondary | ICD-10-CM | POA: Diagnosis not present

## 2016-05-22 DIAGNOSIS — Z833 Family history of diabetes mellitus: Secondary | ICD-10-CM | POA: Diagnosis not present

## 2016-05-22 DIAGNOSIS — R635 Abnormal weight gain: Secondary | ICD-10-CM | POA: Diagnosis not present

## 2016-05-22 DIAGNOSIS — Z130 Encounter for screening for diseases of the blood and blood-forming organs and certain disorders involving the immune mechanism: Secondary | ICD-10-CM | POA: Diagnosis not present

## 2016-06-16 ENCOUNTER — Other Ambulatory Visit: Payer: Self-pay | Admitting: Obstetrics and Gynecology

## 2016-06-16 ENCOUNTER — Other Ambulatory Visit (HOSPITAL_COMMUNITY)
Admission: RE | Admit: 2016-06-16 | Discharge: 2016-06-16 | Disposition: A | Discharge status: Home / Self Care | Payer: 59 | Source: Ambulatory Visit | Attending: Obstetrics and Gynecology | Admitting: Obstetrics and Gynecology

## 2016-06-16 DIAGNOSIS — Z1151 Encounter for screening for human papillomavirus (HPV): Secondary | ICD-10-CM | POA: Insufficient documentation

## 2016-06-16 DIAGNOSIS — Z01419 Encounter for gynecological examination (general) (routine) without abnormal findings: Secondary | ICD-10-CM | POA: Diagnosis not present

## 2016-06-16 DIAGNOSIS — Z3002 Counseling and instruction in natural family planning to avoid pregnancy: Secondary | ICD-10-CM | POA: Diagnosis not present

## 2016-06-18 LAB — CYTOLOGY - PAP

## 2016-11-24 NOTE — L&D Delivery Note (Signed)
Delivery Note At  a viable female was delivered via  (Presentation:LOA ;  ).  APGAR:9 ,9 ; weight pending  .   Placenta status:complete ,abnormal . 3V Cord:  with the following complications: .  Placenta to path Anesthesia:  Epidural Episiotomy:  None Lacerations:  None Est. Blood Loss (mL):  150cc  Mom to postpartum.  Baby to Couplet care / Skin to Skin.  Elizabeth Stephens 10/30/2017, 11:43 PM

## 2017-01-06 ENCOUNTER — Encounter (HOSPITAL_COMMUNITY): Payer: Self-pay

## 2017-01-06 ENCOUNTER — Inpatient Hospital Stay (HOSPITAL_COMMUNITY)
Admission: AD | Admit: 2017-01-06 | Discharge: 2017-01-06 | Disposition: A | Discharge status: Home / Self Care | Payer: 59 | Source: Ambulatory Visit | Attending: Obstetrics & Gynecology | Admitting: Obstetrics & Gynecology

## 2017-01-06 DIAGNOSIS — O209 Hemorrhage in early pregnancy, unspecified: Secondary | ICD-10-CM | POA: Diagnosis not present

## 2017-01-06 DIAGNOSIS — O4691 Antepartum hemorrhage, unspecified, first trimester: Secondary | ICD-10-CM | POA: Insufficient documentation

## 2017-01-06 DIAGNOSIS — O200 Threatened abortion: Secondary | ICD-10-CM

## 2017-01-06 DIAGNOSIS — O3680X0 Pregnancy with inconclusive fetal viability, not applicable or unspecified: Secondary | ICD-10-CM

## 2017-01-06 DIAGNOSIS — R109 Unspecified abdominal pain: Secondary | ICD-10-CM | POA: Diagnosis present

## 2017-01-06 DIAGNOSIS — Z3A00 Weeks of gestation of pregnancy not specified: Secondary | ICD-10-CM | POA: Insufficient documentation

## 2017-01-06 LAB — ABO/RH: ABO/RH(D): B POS

## 2017-01-06 LAB — HCG, QUANTITATIVE, PREGNANCY: hCG, Beta Chain, Quant, S: 113 m[IU]/mL — ABNORMAL HIGH (ref <5)

## 2017-01-06 LAB — URINALYSIS, ROUTINE W REFLEX MICROSCOPIC
Bacteria, UA: NONE SEEN
Bilirubin Urine: NEGATIVE
Glucose, UA: NEGATIVE mg/dL
Ketones, ur: NEGATIVE mg/dL
Leukocytes, UA: NEGATIVE
Nitrite: NEGATIVE
Protein, ur: NEGATIVE mg/dL
Specific Gravity, Urine: 1.03 (ref 1.005–1.030)
pH: 6 (ref 5.0–8.0)

## 2017-01-06 LAB — CBC
HCT: 32.7 % — ABNORMAL LOW (ref 36.0–46.0)
Hemoglobin: 10.9 g/dL — ABNORMAL LOW (ref 12.0–15.0)
MCH: 27.3 pg (ref 26.0–34.0)
MCHC: 33.3 g/dL (ref 30.0–36.0)
MCV: 82 fL (ref 78.0–100.0)
Platelets: 226 10*3/uL (ref 150–400)
RBC: 3.99 MIL/uL (ref 3.87–5.11)
RDW: 13.9 % (ref 11.5–15.5)
WBC: 5.5 10*3/uL (ref 4.0–10.5)

## 2017-01-06 LAB — POCT PREGNANCY, URINE: Preg Test, Ur: POSITIVE — AB

## 2017-01-06 NOTE — MAU Note (Signed)
On the 2nd her period started., last month she thought she was preg, - early symptoms, HPT was neg.  Then the symptoms went away. Has been bleeding and cramping since the 2nd, heavy at times

## 2017-01-06 NOTE — MAU Provider Note (Signed)
Chief Complaint: Abdominal Cramping and Vaginal Bleeding   First Provider Initiated Contact with Patient 01/06/17 1823        SUBJECTIVE HPI: Elizabeth Stephens is a 41 y.o. [id number] at Unknown by LMP who presents to maternity admissions reporting vaginal bleeding and cramping with back pain since Feb 2nd.  On that day, she states she felt like she had symptoms of pregnancy but never took a test.  Started bleeding, so decided it was her period and never took a test.  But then has continued to bleed since then, so came in for the bleeding and pain. . She denies vaginal itching/burning, urinary symptoms, h/a, dizziness, n/v, or fever/chills.    Abdominal Cramping  This is a new problem. The current episode started 1 to 4 weeks ago. The problem occurs constantly. The problem has been unchanged. The pain is located in the suprapubic region. The pain is moderate. The quality of the pain is cramping. The abdominal pain radiates to the back. Pertinent negatives include no constipation, diarrhea, dysuria, fever, frequency, headaches, myalgias, nausea or vomiting. Nothing aggravates the pain. The pain is relieved by nothing. She has tried acetaminophen (ibuprofen) for the symptoms. The treatment provided mild relief.  Vaginal Bleeding  The patient's primary symptoms include vaginal bleeding. The patient's pertinent negatives include no genital itching, genital lesions or genital odor. This is a new problem. The current episode started 1 to 4 weeks ago. The problem occurs constantly. The problem has been unchanged. The pain is mild. She is pregnant. Associated symptoms include abdominal pain and back pain. Pertinent negatives include no constipation, diarrhea, dysuria, fever, frequency, headaches, nausea or vomiting. The vaginal discharge was bloody. The vaginal bleeding is heavier than menses. She has been passing clots. She has not been passing tissue. Nothing aggravates the symptoms. She has tried NSAIDs and  acetaminophen for the symptoms. The treatment provided mild relief.   RN Note: On the 2nd her period started., last month she thought she was preg, - early symptoms, HPT was neg.  Then the symptoms went away. Has been bleeding and cramping since the 2nd, heavy at times  History reviewed. No pertinent past medical history. Past Surgical History:  Procedure Laterality Date  . DILATION AND EVACUATION N/A 06/01/2013   Procedure: DILATATION AND EVACUATION, ultrasound guided;  Surgeon: Dorien Chihuahuaara J. Richardson Doppole, MD;  Location: [facility];  Service: Gynecology;  Laterality: N/A;   Social History   Social History  . Marital status: Married    Spouse name: N/A  . Number of children: N/A  . Years of education: N/A   Occupational History  . Not on file.   Social History Main Topics  . Smoking status: Never Smoker  . Smokeless tobacco: Never Used  . Alcohol use No  . Drug use: No  . Sexual activity: Yes    Birth control/ protection: None   Other Topics Concern  . Not on file   Social History Narrative  . No narrative on file   No current facility-administered medications on file prior to encounter.    Current Outpatient Prescriptions on File Prior to Encounter  Medication Sig Dispense Refill  . acetaminophen (TYLENOL) 325 MG tablet Take 650 mg by mouth every 6 (six) hours as needed for pain.    Marland Kitchen. amoxicillin (AMOXIL) 500 MG capsule Take 1 capsule (500 mg total) by mouth 3 (three) times daily. 30 capsule 0  . doxycycline (VIBRAMYCIN) 50 MG capsule Take 2 capsules (100 mg total) by mouth daily. 14  capsule 0  . HYDROcodone-acetaminophen (NORCO/VICODIN) 5-325 MG per tablet Take 1-2 tablets by mouth every 4 (four) hours as needed. 10 tablet 0  . ibuprofen (ADVIL) 200 MG tablet Take 4 tablets (800 mg total) by mouth every 8 (eight) hours as needed for pain. 30 tablet 0  . meloxicam (MOBIC) 7.5 MG tablet Take 2 tablets (15 mg total) by mouth daily. 30 tablet 0   No Known Allergies  I have reviewed  patient's Past Medical Hx, Surgical Hx, Family Hx, Social Hx, medications and allergies.   ROS:  Review of Systems  Constitutional: Negative for fever.  Gastrointestinal: Positive for abdominal pain. Negative for constipation, diarrhea, nausea and vomiting.  Genitourinary: Positive for vaginal bleeding. Negative for dysuria and frequency.  Musculoskeletal: Positive for back pain. Negative for myalgias.  Neurological: Negative for headaches.   Review of Systems  Other systems negative   Physical Exam  Physical Exam Patient Vitals for the past 24 hrs:  BP Temp Temp src Pulse Resp Weight  01/06/17 1816 121/84 98.2 F (36.8 C) Oral 91 16 179 lb 12 oz (81.5 kg)   Constitutional: Well-developed, well-nourished female in no acute distress.  Cardiovascular: normal rate Respiratory: normal effort GI: Abd soft, non-tender. Pos BS x 4 MS: Extremities nontender, no edema, normal ROM Neurologic: Alert and oriented x 4.  GU: Neg CVAT.  PELVIC EXAM: Cervix pink, visually closed, without lesion, small amount of bloody discharge, vaginal walls and external genitalia normal Bimanual exam: Cervix 0/long/high, firm, anterior, neg CMT, uterus slightly tender, nonenlarged, adnexa without tenderness, enlargement, or mass     No discreet adnexal tenderness   LAB RESULTS Results for orders placed or performed during the hospital encounter of 01/06/17 (from the past 24 hour(s))  Urinalysis, Routine w reflex microscopic     Status: Abnormal   Collection Time: 01/06/17  6:19 PM  Result Value Ref Range   Color, Urine YELLOW YELLOW   APPearance CLEAR CLEAR   Specific Gravity, Urine 1.030 1.005 - 1.030   pH 6.0 5.0 - 8.0   Glucose, UA NEGATIVE NEGATIVE mg/dL   Hgb urine dipstick MODERATE (A) NEGATIVE   Bilirubin Urine NEGATIVE NEGATIVE   Ketones, ur NEGATIVE NEGATIVE mg/dL   Protein, ur NEGATIVE NEGATIVE mg/dL   Nitrite NEGATIVE NEGATIVE   Leukocytes, UA NEGATIVE NEGATIVE   RBC / HPF TOO NUMEROUS  TO COUNT 0 - 5 RBC/hpf   WBC, UA 0-5 0 - 5 WBC/hpf   Bacteria, UA NONE SEEN NONE SEEN   Squamous Epithelial / LPF 0-5 (A) NONE SEEN   Mucous PRESENT   Pregnancy, urine POC     Status: Abnormal   Collection Time: 01/06/17  6:27 PM  Result Value Ref Range   Preg Test, Ur POSITIVE (A) NEGATIVE  ABO/Rh     Status: None (Preliminary result)   Collection Time: 01/06/17  6:40 PM  Result Value Ref Range   ABO/RH(D) B POS   CBC     Status: Abnormal   Collection Time: 01/06/17  6:41 PM  Result Value Ref Range   WBC 5.5 4.0 - 10.5 K/uL   RBC 3.99 3.87 - 5.11 MIL/uL   Hemoglobin 10.9 (L) 12.0 - 15.0 g/dL   HCT 16.1 (L) 09.6 - 04.5 %   MCV 82.0 78.0 - 100.0 fL   MCH 27.3 26.0 - 34.0 pg   MCHC 33.3 30.0 - 36.0 g/dL   RDW 40.9 81.1 - 91.4 %   Platelets 226 150 - 400 K/uL  hCG,  quantitative, pregnancy     Status: Abnormal   Collection Time: 01/06/17  6:41 PM  Result Value Ref Range   hCG, Beta Chain, Quant, S 113 (H) <5 mIU/mL       IMAGING No results found.  MAU Management/MDM: Ordered usual first trimester r/o ectopic labs.   Pelvic exam done  Consult Dr Sallye Ober with presentation, exam findings, and results.   She recommends repeating HCG on Friday morning with strict ectopic precautions  This bleeding/pain can represent a normal pregnancy with bleeding, spontaneous abortion or even an ectopic which can be life-threatening.  The process as listed above helps to determine which of these is present.    ASSESSMENT Pregnancy of unknown location Bleeding in first trimester, likely SAB  PLAN Discharge home Plan to repeat HCG level in 48 hours in office   Ectopic precautions   Pt stable at time of discharge. Encouraged to return here or to other Urgent Care/ED if she develops worsening of symptoms, increase in pain, fever, or other concerning symptoms.    Wynelle Bourgeois CNM, MSN Certified Nurse-Midwife 01/06/2017  6:34 PM

## 2017-01-06 NOTE — Discharge Instructions (Signed)
Threatened Miscarriage °A threatened miscarriage is when you have vaginal bleeding during your first 20 weeks of pregnancy but the pregnancy has not ended. Your doctor will do tests to make sure you are still pregnant. The cause of the bleeding may not be known. This condition does not mean your pregnancy will end. It does increase the risk of it ending (complete miscarriage). °Follow these instructions at home: °· Make sure you keep all your doctor visits for prenatal care. °· Get plenty of rest. °· Do not have sex or use tampons if you have vaginal bleeding. °· Do not douche. °· Do not smoke or use drugs. °· Do not drink alcohol. °· Avoid caffeine. °Contact a doctor if: °· You have light bleeding from your vagina. °· You have belly pain or cramping. °· You have a fever. °Get help right away if: °· You have heavy bleeding from your vagina. °· You have clots of blood coming from your vagina. °· You have bad pain or cramps in your low back or belly. °· You have fever, chills, and bad belly pain. °This information is not intended to replace advice given to you by your health care provider. Make sure you discuss any questions you have with your health care provider. °Document Released: 10/23/2008 Document Revised: 04/17/2016 Document Reviewed: 09/06/2013 °Elsevier Interactive Patient Education © 2017 [employer] ° °

## 2017-01-09 DIAGNOSIS — O200 Threatened abortion: Secondary | ICD-10-CM | POA: Diagnosis not present

## 2017-01-21 ENCOUNTER — Encounter: Payer: Self-pay | Admitting: Emergency Medicine

## 2017-01-21 ENCOUNTER — Emergency Department
Admission: EM | Admit: 2017-01-21 | Discharge: 2017-01-21 | Disposition: A | Discharge status: Home / Self Care | Payer: 59 | Attending: Emergency Medicine | Admitting: Emergency Medicine

## 2017-01-21 DIAGNOSIS — Y9389 Activity, other specified: Secondary | ICD-10-CM | POA: Diagnosis not present

## 2017-01-21 DIAGNOSIS — Y92481 Parking lot as the place of occurrence of the external cause: Secondary | ICD-10-CM | POA: Diagnosis not present

## 2017-01-21 DIAGNOSIS — M7582 Other shoulder lesions, left shoulder: Secondary | ICD-10-CM

## 2017-01-21 DIAGNOSIS — M75102 Unspecified rotator cuff tear or rupture of left shoulder, not specified as traumatic: Secondary | ICD-10-CM | POA: Diagnosis not present

## 2017-01-21 DIAGNOSIS — Y999 Unspecified external cause status: Secondary | ICD-10-CM | POA: Insufficient documentation

## 2017-01-21 DIAGNOSIS — M7592 Shoulder lesion, unspecified, left shoulder: Secondary | ICD-10-CM | POA: Diagnosis not present

## 2017-01-21 DIAGNOSIS — S4992XA Unspecified injury of left shoulder and upper arm, initial encounter: Secondary | ICD-10-CM | POA: Diagnosis present

## 2017-01-21 DIAGNOSIS — Z791 Long term (current) use of non-steroidal anti-inflammatories (NSAID): Secondary | ICD-10-CM | POA: Insufficient documentation

## 2017-01-21 MED ORDER — MELOXICAM 15 MG PO TABS: 15.0000 mg | ORAL_TABLET | Freq: Every day | ORAL | 0 refills | Status: DC
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
## 2017-01-30 DIAGNOSIS — Z30014 Encounter for initial prescription of intrauterine contraceptive device: Secondary | ICD-10-CM | POA: Diagnosis not present

## 2017-01-30 DIAGNOSIS — O039 Complete or unspecified spontaneous abortion without complication: Secondary | ICD-10-CM | POA: Diagnosis not present

## 2017-04-06 DIAGNOSIS — R112 Nausea with vomiting, unspecified: Secondary | ICD-10-CM | POA: Diagnosis not present

## 2017-04-06 DIAGNOSIS — M545 Low back pain: Secondary | ICD-10-CM | POA: Diagnosis not present

## 2017-04-06 DIAGNOSIS — N841 Polyp of cervix uteri: Secondary | ICD-10-CM | POA: Diagnosis not present

## 2017-04-06 DIAGNOSIS — O200 Threatened abortion: Secondary | ICD-10-CM | POA: Diagnosis not present

## 2017-04-06 LAB — OB RESULTS CONSOLE ABO/RH: RH Type: POSITIVE

## 2017-04-08 DIAGNOSIS — Z3A08 8 weeks gestation of pregnancy: Secondary | ICD-10-CM | POA: Diagnosis not present

## 2017-04-08 DIAGNOSIS — O26851 Spotting complicating pregnancy, first trimester: Secondary | ICD-10-CM | POA: Diagnosis not present

## 2017-04-08 DIAGNOSIS — O09521 Supervision of elderly multigravida, first trimester: Secondary | ICD-10-CM | POA: Diagnosis not present

## 2017-04-08 DIAGNOSIS — O200 Threatened abortion: Secondary | ICD-10-CM | POA: Diagnosis not present

## 2017-04-13 DIAGNOSIS — Z3480 Encounter for supervision of other normal pregnancy, unspecified trimester: Secondary | ICD-10-CM | POA: Diagnosis not present

## 2017-04-13 DIAGNOSIS — O210 Mild hyperemesis gravidarum: Secondary | ICD-10-CM | POA: Diagnosis not present

## 2017-04-13 DIAGNOSIS — Z3482 Encounter for supervision of other normal pregnancy, second trimester: Secondary | ICD-10-CM | POA: Diagnosis not present

## 2017-04-13 LAB — OB RESULTS CONSOLE RPR: RPR: NONREACTIVE

## 2017-04-13 LAB — OB RESULTS CONSOLE ANTIBODY SCREEN: Antibody Screen: NEGATIVE

## 2017-04-13 LAB — OB RESULTS CONSOLE HIV ANTIBODY (ROUTINE TESTING): HIV: NONREACTIVE

## 2017-04-13 LAB — OB RESULTS CONSOLE RUBELLA ANTIBODY, IGM: Rubella: IMMUNE

## 2017-04-13 LAB — OB RESULTS CONSOLE GC/CHLAMYDIA
Chlamydia: NEGATIVE
Gonorrhea: NEGATIVE

## 2017-04-13 LAB — OB RESULTS CONSOLE HEPATITIS B SURFACE ANTIGEN: Hepatitis B Surface Ag: NEGATIVE

## 2017-05-08 DIAGNOSIS — N898 Other specified noninflammatory disorders of vagina: Secondary | ICD-10-CM | POA: Diagnosis not present

## 2017-05-08 DIAGNOSIS — O418X21 Other specified disorders of amniotic fluid and membranes, second trimester, fetus 1: Secondary | ICD-10-CM | POA: Diagnosis not present

## 2017-05-08 DIAGNOSIS — N841 Polyp of cervix uteri: Secondary | ICD-10-CM | POA: Diagnosis not present

## 2017-05-08 DIAGNOSIS — O468X2 Other antepartum hemorrhage, second trimester: Secondary | ICD-10-CM | POA: Diagnosis not present

## 2017-05-08 DIAGNOSIS — Z3482 Encounter for supervision of other normal pregnancy, second trimester: Secondary | ICD-10-CM | POA: Diagnosis not present

## 2017-06-09 DIAGNOSIS — Z36 Encounter for antenatal screening for chromosomal anomalies: Secondary | ICD-10-CM | POA: Diagnosis not present

## 2017-06-09 DIAGNOSIS — N898 Other specified noninflammatory disorders of vagina: Secondary | ICD-10-CM | POA: Diagnosis not present

## 2017-06-09 DIAGNOSIS — Z3482 Encounter for supervision of other normal pregnancy, second trimester: Secondary | ICD-10-CM | POA: Diagnosis not present

## 2017-06-09 DIAGNOSIS — O09522 Supervision of elderly multigravida, second trimester: Secondary | ICD-10-CM | POA: Diagnosis not present

## 2017-08-04 DIAGNOSIS — Z3482 Encounter for supervision of other normal pregnancy, second trimester: Secondary | ICD-10-CM | POA: Diagnosis not present

## 2017-08-04 DIAGNOSIS — Z3483 Encounter for supervision of other normal pregnancy, third trimester: Secondary | ICD-10-CM | POA: Diagnosis not present

## 2017-08-14 DIAGNOSIS — O99810 Abnormal glucose complicating pregnancy: Secondary | ICD-10-CM | POA: Diagnosis not present

## 2017-08-26 ENCOUNTER — Encounter: Payer: 59 | Attending: Obstetrics and Gynecology | Admitting: Registered"

## 2017-08-26 DIAGNOSIS — Z3A00 Weeks of gestation of pregnancy not specified: Secondary | ICD-10-CM | POA: Insufficient documentation

## 2017-08-26 DIAGNOSIS — R7309 Other abnormal glucose: Secondary | ICD-10-CM

## 2017-08-26 DIAGNOSIS — Z713 Dietary counseling and surveillance: Secondary | ICD-10-CM | POA: Diagnosis not present

## 2017-08-26 DIAGNOSIS — O99810 Abnormal glucose complicating pregnancy: Secondary | ICD-10-CM | POA: Insufficient documentation

## 2017-08-27 NOTE — Progress Notes (Signed)
Patient was seen on 08/26/2017 for Gestational Diabetes self-management class at the [facility]. The following learning objectives were met by the patient during this course:   States the definition of Gestational Diabetes  States why dietary management is important in controlling blood glucose  Describes the effects each nutrient has on blood glucose levels  Demonstrates ability to create a balanced meal plan  Demonstrates carbohydrate counting   States when to check blood glucose levels  Demonstrates proper blood glucose monitoring techniques  States the effect of stress and exercise on blood glucose levels  States the importance of limiting caffeine and abstaining from alcohol and smoking  Blood glucose monitor given: [employer] Next Lot # [id number] Exp: 08/23/2017 (date on box expired, but gave to pt because test strip box expiration is 11/23/2017) Blood glucose reading: 104  Patient instructed to monitor glucose levels: FBS: 60 - <95 1 hour: <140 2 hour: <120  Patient received handouts:  Nutrition Diabetes and Pregnancy  Carbohydrate Counting List  Patient will be seen for follow-up as needed.

## 2017-08-31 ENCOUNTER — Encounter: Payer: Self-pay | Admitting: Registered"

## 2017-08-31 DIAGNOSIS — R7309 Other abnormal glucose: Secondary | ICD-10-CM | POA: Insufficient documentation

## 2017-09-11 DIAGNOSIS — B078 Other viral warts: Secondary | ICD-10-CM | POA: Diagnosis not present

## 2017-09-18 ENCOUNTER — Other Ambulatory Visit (HOSPITAL_COMMUNITY): Payer: Self-pay | Admitting: Obstetrics and Gynecology

## 2017-09-18 DIAGNOSIS — O24415 Gestational diabetes mellitus in pregnancy, controlled by oral hypoglycemic drugs: Secondary | ICD-10-CM | POA: Diagnosis not present

## 2017-09-18 DIAGNOSIS — Z23 Encounter for immunization: Secondary | ICD-10-CM | POA: Diagnosis not present

## 2017-09-18 DIAGNOSIS — O24419 Gestational diabetes mellitus in pregnancy, unspecified control: Secondary | ICD-10-CM

## 2017-09-18 DIAGNOSIS — Z3A34 34 weeks gestation of pregnancy: Secondary | ICD-10-CM

## 2017-09-21 ENCOUNTER — Other Ambulatory Visit (HOSPITAL_COMMUNITY): Payer: Self-pay | Admitting: Obstetrics and Gynecology

## 2017-09-21 ENCOUNTER — Encounter (HOSPITAL_COMMUNITY): Payer: Self-pay

## 2017-09-21 DIAGNOSIS — Z3A33 33 weeks gestation of pregnancy: Secondary | ICD-10-CM

## 2017-09-21 DIAGNOSIS — O24415 Gestational diabetes mellitus in pregnancy, controlled by oral hypoglycemic drugs: Secondary | ICD-10-CM

## 2017-09-21 DIAGNOSIS — Z3689 Encounter for other specified antenatal screening: Secondary | ICD-10-CM

## 2017-09-22 ENCOUNTER — Ambulatory Visit (HOSPITAL_COMMUNITY)
Admission: RE | Admit: 2017-09-22 | Discharge: 2017-09-22 | Disposition: A | Discharge status: Home / Self Care | Payer: 59 | Source: Ambulatory Visit | Attending: Obstetrics and Gynecology | Admitting: Obstetrics and Gynecology

## 2017-09-22 ENCOUNTER — Other Ambulatory Visit (HOSPITAL_COMMUNITY): Payer: Self-pay | Admitting: Obstetrics and Gynecology

## 2017-09-22 ENCOUNTER — Encounter (HOSPITAL_COMMUNITY): Payer: Self-pay

## 2017-09-22 DIAGNOSIS — Z3A33 33 weeks gestation of pregnancy: Secondary | ICD-10-CM

## 2017-09-22 DIAGNOSIS — Z7984 Long term (current) use of oral hypoglycemic drugs: Secondary | ICD-10-CM | POA: Diagnosis not present

## 2017-09-22 DIAGNOSIS — O24419 Gestational diabetes mellitus in pregnancy, unspecified control: Secondary | ICD-10-CM

## 2017-09-22 DIAGNOSIS — O09523 Supervision of elderly multigravida, third trimester: Secondary | ICD-10-CM

## 2017-09-22 DIAGNOSIS — O24415 Gestational diabetes mellitus in pregnancy, controlled by oral hypoglycemic drugs: Secondary | ICD-10-CM

## 2017-09-22 DIAGNOSIS — Z3A34 34 weeks gestation of pregnancy: Secondary | ICD-10-CM

## 2017-09-22 DIAGNOSIS — Z3689 Encounter for other specified antenatal screening: Secondary | ICD-10-CM

## 2017-09-22 HISTORY — DX: Gestational diabetes mellitus in pregnancy, unspecified control: O24.419

## 2017-10-02 DIAGNOSIS — O24415 Gestational diabetes mellitus in pregnancy, controlled by oral hypoglycemic drugs: Secondary | ICD-10-CM | POA: Diagnosis not present

## 2017-10-06 DIAGNOSIS — O09523 Supervision of elderly multigravida, third trimester: Secondary | ICD-10-CM | POA: Diagnosis not present

## 2017-10-06 DIAGNOSIS — O24415 Gestational diabetes mellitus in pregnancy, controlled by oral hypoglycemic drugs: Secondary | ICD-10-CM | POA: Diagnosis not present

## 2017-10-09 DIAGNOSIS — O24419 Gestational diabetes mellitus in pregnancy, unspecified control: Secondary | ICD-10-CM | POA: Diagnosis not present

## 2017-10-09 DIAGNOSIS — Z3483 Encounter for supervision of other normal pregnancy, third trimester: Secondary | ICD-10-CM | POA: Diagnosis not present

## 2017-10-13 DIAGNOSIS — Z3483 Encounter for supervision of other normal pregnancy, third trimester: Secondary | ICD-10-CM | POA: Diagnosis not present

## 2017-10-13 DIAGNOSIS — O24415 Gestational diabetes mellitus in pregnancy, controlled by oral hypoglycemic drugs: Secondary | ICD-10-CM | POA: Diagnosis not present

## 2017-10-13 DIAGNOSIS — O09523 Supervision of elderly multigravida, third trimester: Secondary | ICD-10-CM | POA: Diagnosis not present

## 2017-10-14 LAB — OB RESULTS CONSOLE GBS: GBS: POSITIVE

## 2017-10-20 DIAGNOSIS — O09523 Supervision of elderly multigravida, third trimester: Secondary | ICD-10-CM | POA: Diagnosis not present

## 2017-10-20 DIAGNOSIS — O24415 Gestational diabetes mellitus in pregnancy, controlled by oral hypoglycemic drugs: Secondary | ICD-10-CM | POA: Diagnosis not present

## 2017-10-22 ENCOUNTER — Encounter (HOSPITAL_COMMUNITY): Payer: Self-pay | Admitting: *Deleted

## 2017-10-22 ENCOUNTER — Telehealth (HOSPITAL_COMMUNITY): Payer: Self-pay | Admitting: *Deleted

## 2017-10-22 NOTE — Telephone Encounter (Signed)
Preadmission screen  

## 2017-10-27 DIAGNOSIS — O24415 Gestational diabetes mellitus in pregnancy, controlled by oral hypoglycemic drugs: Secondary | ICD-10-CM | POA: Diagnosis not present

## 2017-10-27 DIAGNOSIS — O09523 Supervision of elderly multigravida, third trimester: Secondary | ICD-10-CM | POA: Diagnosis not present

## 2017-10-29 ENCOUNTER — Other Ambulatory Visit: Payer: Self-pay | Admitting: Obstetrics and Gynecology

## 2017-10-30 ENCOUNTER — Inpatient Hospital Stay (HOSPITAL_COMMUNITY): Payer: 59 | Admitting: Anesthesiology

## 2017-10-30 ENCOUNTER — Encounter (HOSPITAL_COMMUNITY): Payer: Self-pay

## 2017-10-30 ENCOUNTER — Inpatient Hospital Stay (HOSPITAL_COMMUNITY)
Admission: RE | Admit: 2017-10-30 | Discharge: 2017-11-01 | DRG: 807 | Disposition: A | Discharge status: Home / Self Care | Payer: 59 | Source: Ambulatory Visit | Attending: Obstetrics and Gynecology | Admitting: Obstetrics and Gynecology

## 2017-10-30 DIAGNOSIS — D649 Anemia, unspecified: Secondary | ICD-10-CM | POA: Diagnosis present

## 2017-10-30 DIAGNOSIS — O24429 Gestational diabetes mellitus in childbirth, unspecified control: Secondary | ICD-10-CM | POA: Diagnosis not present

## 2017-10-30 DIAGNOSIS — O99824 Streptococcus B carrier state complicating childbirth: Secondary | ICD-10-CM | POA: Diagnosis present

## 2017-10-30 DIAGNOSIS — O9902 Anemia complicating childbirth: Secondary | ICD-10-CM | POA: Diagnosis present

## 2017-10-30 DIAGNOSIS — O24419 Gestational diabetes mellitus in pregnancy, unspecified control: Secondary | ICD-10-CM | POA: Diagnosis present

## 2017-10-30 DIAGNOSIS — O24425 Gestational diabetes mellitus in childbirth, controlled by oral hypoglycemic drugs: Secondary | ICD-10-CM | POA: Diagnosis not present

## 2017-10-30 DIAGNOSIS — Z3A39 39 weeks gestation of pregnancy: Secondary | ICD-10-CM | POA: Diagnosis not present

## 2017-10-30 LAB — GLUCOSE, CAPILLARY
Glucose-Capillary: 97 mg/dL (ref 65–99)
Glucose-Capillary: 117 mg/dL — ABNORMAL HIGH (ref 65–99)
Glucose-Capillary: 79 mg/dL (ref 65–99)

## 2017-10-30 LAB — CBC
HCT: 29.8 % — ABNORMAL LOW (ref 36.0–46.0)
Hemoglobin: 10.1 g/dL — ABNORMAL LOW (ref 12.0–15.0)
MCH: 29.8 pg (ref 26.0–34.0)
MCHC: 33.9 g/dL (ref 30.0–36.0)
MCV: 87.9 fL (ref 78.0–100.0)
Platelets: 142 10*3/uL — ABNORMAL LOW (ref 150–400)
RBC: 3.39 MIL/uL — ABNORMAL LOW (ref 3.87–5.11)
RDW: 14.8 % (ref 11.5–15.5)
WBC: 6.9 10*3/uL (ref 4.0–10.5)

## 2017-10-30 LAB — TYPE AND SCREEN
ABO/RH(D): B POS
Antibody Screen: NEGATIVE

## 2017-10-30 MED ORDER — OXYCODONE-ACETAMINOPHEN 5-325 MG PO TABS: 2.0000 | ORAL_TABLET | ORAL | Status: DC | PRN

## 2017-10-30 MED ORDER — EPHEDRINE 5 MG/ML INJ
10.0000 mg | INTRAVENOUS | Status: DC | PRN
  Filled 2017-10-30: qty 2

## 2017-10-30 MED ORDER — TERBUTALINE SULFATE 1 MG/ML IJ SOLN
0.2500 mg | Freq: Once | INTRAMUSCULAR | Status: DC | PRN
  Filled 2017-10-30: qty 1

## 2017-10-30 MED ORDER — FENTANYL CITRATE (PF) 100 MCG/2ML IJ SOLN
50.0000 ug | INTRAMUSCULAR | Status: DC | PRN
Start: 2017-10-30 — End: 2017-10-30

## 2017-10-30 MED ORDER — OXYTOCIN 40 UNITS IN LACTATED RINGERS INFUSION - SIMPLE MED
2.5000 [IU]/h | INTRAVENOUS | Status: DC
  Filled 2017-10-30: qty 1000

## 2017-10-30 MED ORDER — LIDOCAINE HCL (PF) 1 % IJ SOLN
30.0000 mL | INTRAMUSCULAR | Status: DC | PRN
  Filled 2017-10-30: qty 30

## 2017-10-30 MED ORDER — PHENYLEPHRINE 40 MCG/ML (10ML) SYRINGE FOR IV PUSH (FOR BLOOD PRESSURE SUPPORT)
80.0000 ug | PREFILLED_SYRINGE | INTRAVENOUS | Status: DC | PRN
  Filled 2017-10-30: qty 10
  Filled 2017-10-30: qty 5

## 2017-10-30 MED ORDER — OXYTOCIN 40 UNITS IN LACTATED RINGERS INFUSION - SIMPLE MED
1.0000 m[IU]/min | INTRAVENOUS | Status: DC
  Administered 2017-10-30: 2 m[IU]/min via INTRAVENOUS
  Filled 2017-10-30: qty 1000

## 2017-10-30 MED ORDER — PENICILLIN G POT IN DEXTROSE 60000 UNIT/ML IV SOLN
3.0000 10*6.[IU] | INTRAVENOUS | Status: DC
  Administered 2017-10-30 (×2): 3 10*6.[IU] via INTRAVENOUS
  Filled 2017-10-30 (×6): qty 50

## 2017-10-30 MED ORDER — LIDOCAINE HCL (PF) 1 % IJ SOLN
INTRAMUSCULAR | Status: DC | PRN
  Administered 2017-10-30: 7 mL via EPIDURAL
  Administered 2017-10-30: 5 mL via EPIDURAL

## 2017-10-30 MED ORDER — LACTATED RINGERS IV SOLN
500.0000 mL | INTRAVENOUS | Status: DC | PRN
  Administered 2017-10-30: 500 mL via INTRAVENOUS

## 2017-10-30 MED ORDER — TERBUTALINE SULFATE 1 MG/ML IJ SOLN: 0.2500 mg | Freq: Once | INTRAMUSCULAR | Status: DC | PRN

## 2017-10-30 MED ORDER — FENTANYL CITRATE (PF) 100 MCG/2ML IJ SOLN
100.0000 ug | INTRAMUSCULAR | Status: DC | PRN
  Administered 2017-10-30: 100 ug via INTRAVENOUS
  Filled 2017-10-30: qty 2

## 2017-10-30 MED ORDER — PHENYLEPHRINE 40 MCG/ML (10ML) SYRINGE FOR IV PUSH (FOR BLOOD PRESSURE SUPPORT)
80.0000 ug | PREFILLED_SYRINGE | INTRAVENOUS | Status: DC | PRN
  Filled 2017-10-30: qty 5

## 2017-10-30 MED ORDER — LACTATED RINGERS IV SOLN
INTRAVENOUS | Status: DC
  Administered 2017-10-30 (×3): via INTRAVENOUS

## 2017-10-30 MED ORDER — ONDANSETRON HCL 4 MG/2ML IJ SOLN
4.0000 mg | Freq: Four times a day (QID) | INTRAMUSCULAR | Status: DC | PRN
  Administered 2017-10-30: 4 mg via INTRAVENOUS
  Filled 2017-10-30: qty 2

## 2017-10-30 MED ORDER — SOD CITRATE-CITRIC ACID 500-334 MG/5ML PO SOLN: 30.0000 mL | ORAL | Status: DC | PRN

## 2017-10-30 MED ORDER — FENTANYL 2.5 MCG/ML BUPIVACAINE 1/10 % EPIDURAL INFUSION (WH - ANES)
14.0000 mL/h | INTRAMUSCULAR | Status: DC | PRN
  Administered 2017-10-30 (×2): 14 mL/h via EPIDURAL
  Filled 2017-10-30: qty 100

## 2017-10-30 MED ORDER — LACTATED RINGERS IV SOLN
500.0000 mL | Freq: Once | INTRAVENOUS | Status: AC
  Administered 2017-10-30: 500 mL via INTRAVENOUS

## 2017-10-30 MED ORDER — OXYCODONE-ACETAMINOPHEN 5-325 MG PO TABS: 1.0000 | ORAL_TABLET | ORAL | Status: DC | PRN

## 2017-10-30 MED ORDER — ACETAMINOPHEN 325 MG PO TABS: 650.0000 mg | ORAL_TABLET | ORAL | Status: DC | PRN

## 2017-10-30 MED ORDER — MISOPROSTOL 25 MCG QUARTER TABLET
25.0000 ug | ORAL_TABLET | ORAL | Status: DC | PRN
  Administered 2017-10-30: 25 ug via VAGINAL
  Filled 2017-10-30 (×3): qty 1

## 2017-10-30 MED ORDER — OXYTOCIN BOLUS FROM INFUSION
500.0000 mL | Freq: Once | INTRAVENOUS | Status: AC
  Administered 2017-10-30: 500 mL via INTRAVENOUS

## 2017-10-30 MED ORDER — PENICILLIN G POTASSIUM 5000000 UNITS IJ SOLR
5.0000 10*6.[IU] | Freq: Once | INTRAMUSCULAR | Status: AC
  Administered 2017-10-30: 5 10*6.[IU] via INTRAVENOUS
  Filled 2017-10-30: qty 5

## 2017-10-30 MED ORDER — DIPHENHYDRAMINE HCL 50 MG/ML IJ SOLN: 12.5000 mg | INTRAMUSCULAR | Status: DC | PRN

## 2017-10-30 NOTE — Anesthesia Pain Management Evaluation Note (Signed)
  [facility] Pain Management Visit Note  Patient: Elizabeth Stephens, 41 y.o., female  "Hello I am a member of the anesthesia team at [facility]. We have an anesthesia team available at all times to provide care throughout the hospital, including epidural management and anesthesia for C-section. I don't know your plan for the delivery whether it a natural birth, water birth, IV sedation, nitrous supplementation, doula or epidural, but we want to meet your pain goals."   1.Was your pain managed to your expectations on prior hospitalizations?   Yes   2.What is your expectation for pain management during this hospitalization?     Epidural  3.How can we help you reach that goal? epidural  Record the patient's initial score and the patient's pain goal.   Pain: 5  Pain Goal: 6 [facility] wants you to be able to say your pain was always managed very well.  Zykeriah Mathia 10/30/2017

## 2017-10-30 NOTE — Anesthesia Preprocedure Evaluation (Signed)
Anesthesia Evaluation  Patient identified by MRN, date of birth, ID band Patient awake    Reviewed: Allergy & Precautions, H&P , NPO status , Patient's Chart, lab work & pertinent test results  Airway Mallampati: I  TM Distance: >3 FB Neck ROM: full    Dental no notable dental hx. (+) Teeth Intact   Pulmonary neg pulmonary ROS,    Pulmonary exam normal breath sounds clear to auscultation       Cardiovascular negative cardio ROS Normal cardiovascular exam Rhythm:regular Rate:Normal     Neuro/Psych negative neurological ROS  negative psych ROS   GI/Hepatic negative GI ROS, Neg liver ROS,   Endo/Other  diabetes, Gestational  Renal/GU negative Renal ROS     Musculoskeletal negative musculoskeletal ROS (+)   Abdominal (+) + obese,   Peds  Hematology  (+) Blood dyscrasia, anemia ,   Anesthesia Other Findings   Reproductive/Obstetrics (+) Pregnancy                             Anesthesia Physical Anesthesia Plan  ASA: II  Anesthesia Plan: Epidural   Post-op Pain Management:    Induction:   PONV Risk Score and Plan:   Airway Management Planned:   Additional Equipment:   Intra-op Plan:   Post-operative Plan:   Informed Consent: I have reviewed the patients History and Physical, chart, labs and discussed the procedure including the risks, benefits and alternatives for the proposed anesthesia with the patient or authorized representative who has indicated his/her understanding and acceptance.     Plan Discussed with:   Anesthesia Plan Comments:         Anesthesia Quick Evaluation

## 2017-10-30 NOTE — Anesthesia Procedure Notes (Signed)
Epidural Patient location during procedure: OB Start time: 10/30/2017 8:04 PM End time: 10/30/2017 8:07 PM  Staffing Anesthesiologist: Leilani AbleHatchett, Maggi Hershkowitz, MD Performed: anesthesiologist   Preanesthetic Checklist Completed: patient identified, site marked, surgical consent, pre-op evaluation, timeout performed, IV checked, risks and benefits discussed and monitors and equipment checked  Epidural Patient position: sitting Prep: site prepped and draped and DuraPrep Patient monitoring: continuous pulse ox and blood pressure Approach: midline Location: L3-L4 Injection technique: LOR air  Needle:  Needle type: Tuohy  Needle gauge: 17 G Needle length: 9 cm and 9 Needle insertion depth: 6 cm Catheter type: closed end flexible Catheter size: 19 Gauge Catheter at skin depth: 11 cm Test dose: negative and Other  Assessment Sensory level: T9 Events: blood not aspirated, injection not painful, no injection resistance, negative IV test and no paresthesia  Additional Notes Reason for block:procedure for pain

## 2017-10-30 NOTE — Progress Notes (Signed)
Elizabeth Stephens is a 41 y.o. [id number] at 1110w0d by LMP admitted for induction of labor due to Gestational diabetes.  Subjective: Contractions are more painful 7 out of 10  Objective: BP 127/77   Pulse 91   Temp 98.3 F (36.8 C) (Axillary)   Resp 18   Ht 5\' 6"  (1.676 m)   Wt 88.5 kg (195 lb)   LMP 02/05/2017   BMI 31.47 kg/m  No intake/output data recorded. No intake/output data recorded.  FHT:  FHR: 130 bpm, variability: moderate,  accelerations:  Present,  decelerations:  Absent UC:   regular, every 1-3 minutes SVE:   Dilation: 1.5 Effacement (%): 50 Station: Ballotable Exam by:: Dr. Richardson Doppole  Labs: Lab Results  Component Value Date   WBC 6.9 10/30/2017   HGB 10.1 (L) 10/30/2017   HCT 29.8 (L) 10/30/2017   MCV 87.9 10/30/2017   PLT 142 (L) 10/30/2017    Assessment / Plan: Induction of labor due to gestational diabetes start pitocin 1x1 Labor: start pitocin  AROM with fetal descent  Preeclampsia:  NA Fetal Wellbeing:  Category I Pain Control:  Labor support without medications I/D:  penicillin Anticipated MOD:  NSVD  CCOB covering after 5 pm .   Slate Debroux J. 10/30/2017, 4:32 PM

## 2017-10-30 NOTE — Progress Notes (Signed)
Pt scheduled for induction at midnight. Pt did not show up; RN called the 2 numbers listed in pt chart; no answer; left a message on both letting her know that she was scheduled for induction at midnight and to please call Labor and delivery; gave number. RN also called Dr Dion BodyVarnado to inform of pt not showing up for her induction.

## 2017-10-30 NOTE — Progress Notes (Signed)
Subjective: Pt comfortable with epidural.  Feeling some rectal pressure  Objective: BP (!) 143/80   Pulse 76   Temp 98.3 F (36.8 C) (Axillary)   Resp 16   Ht 5\' 6"  (1.676 m)   Wt 88.5 kg (195 lb)   LMP 02/05/2017   SpO2 100%   BMI 31.47 kg/m  No intake/output data recorded. No intake/output data recorded.  FHT: Category 2 UC:   regular, every 2-4 minutes SVE:   Dilation: 4 Effacement (%): 100 Station: 0 Exam by:: N. Prothero, CNM Pitocin at 4mu SROM light mec  Assessment:  Pt is a [id number] at 39 weeks induction for GDMA2 Cat 2 strip GBS positive Plan: Position changes Anticipate SVD  Kenney HousemanNancy Jean Prothero CNM, MSN 10/30/2017, 9:51 PM

## 2017-10-30 NOTE — H&P (Signed)
Elizabeth Stephens is a 41 y.o. female presenting for induction of labor due to gesational diabetes. She has been on glyburide 5 mg daily with some bs above target. Prenatal care provided by Dr. Gerald Leitzara Kitt Ledet [facility]. +FM contractoins q 2-4 minutes rated as 5 out 10 no vaginal bleeding no lof. . OB History    Gravida Para Term Preterm AB Living   6 3 3   2 3    SAB TAB Ectopic Multiple Live Births   2       3     Past Medical History:  Diagnosis Date  . Gestational diabetes    glyburide   Past Surgical History:  Procedure Laterality Date  . DILATION AND EVACUATION N/A 06/01/2013   Procedure: DILATATION AND EVACUATION, ultrasound guided;  Surgeon: Dorien Chihuahuaara J. Richardson Doppole, MD;  Location: [facility];  Service: Gynecology;  Laterality: N/A;   Family History: family history includes Arthritis in her mother; Diabetes in her father; Hypertension in her father. Social History:  reports that  has never smoked. she has never used smokeless tobacco. She reports that she does not drink alcohol or use drugs.     Maternal Diabetes: No Genetic Screening: Normal Maternal Ultrasounds/Referrals: Normal Fetal Ultrasounds or other Referrals:  None Maternal Substance Abuse:  No Significant Maternal Medications:  Meds include: Other:  Significant Maternal Lab Results:  Lab values include: Group B Strep positive Other Comments:  pt on glyburide for gestational diabetes   Review of Systems  Constitutional: Negative.   HENT: Negative.   Eyes: Negative.   Respiratory: Negative.   Cardiovascular: Negative.   Gastrointestinal: Negative.   Genitourinary: Negative.   Musculoskeletal: Negative.   Skin: Negative.   Neurological: Negative.   Endo/Heme/Allergies: Negative.   Psychiatric/Behavioral: Negative.    Maternal Medical History:  Fetal activity: Perceived fetal activity is normal.    Prenatal Complications - Diabetes: gestational. Diabetes is managed by oral agent (monotherapy).      Dilation:  1 Effacement (%): 50 Station: -3 Exam by:: Cher NakaiKristin McLeod RN Blood pressure 131/80, pulse 88, temperature 98.3 F (36.8 C), temperature source Oral, resp. rate 18, height 5\' 6"  (1.676 m), weight 88.5 kg (195 lb), last menstrual period 02/05/2017, unknown if currently breastfeeding. Maternal Exam:  Uterine Assessment: Contraction strength is moderate.  Contraction frequency is irregular.   Abdomen: Patient reports no abdominal tenderness. Estimated fetal weight is 7 lbs 8 oz .Marland Kitchen. fetus was 6 lbs and 6 oz on ultrasound 10/13/2017.   Fetal presentation: vertex  Introitus: Normal vulva. Normal vagina.  Pelvis: adequate for delivery.      Fetal Exam Fetal Monitor Review: Baseline rate: 130.  Variability: moderate (6-25 bpm).   Pattern: accelerations present.    Fetal State Assessment: Category I - tracings are normal.     Physical Exam  Vitals reviewed. Constitutional: She is oriented to person, place, and time. She appears well-developed and well-nourished.  HENT:  Head: Normocephalic and atraumatic.  Eyes: Conjunctivae are normal. Pupils are equal, round, and reactive to light.  Neck: Normal range of motion. Neck supple.  Cardiovascular: Normal rate and regular rhythm.  Respiratory: Effort normal and breath sounds normal.  GI: There is no tenderness.  Genitourinary: Vagina normal.  Musculoskeletal: Normal range of motion. She exhibits no edema.  Neurological: She is alert and oriented to person, place, and time. She has normal reflexes.  Skin: Skin is warm and dry.  Psychiatric: She has a normal mood and affect.    Prenatal labs:  ABO, Rh: --/--/B POS (12/07 [id number]) Antibody: NEG (12/07 [id number]) Rubella: Immune (05/21 0000) RPR: Nonreactive (05/21 0000)  HBsAg: Negative (05/21 0000)  HIV: Non-reactive (05/21 0000)  GBS: Positive (11/21 0000)   Assessment/Plan: 39 wks and 0 days with  Gestational diabetes on glyburide.  [facility].  Cytotec for  cervical ripening  Penicillin for GBS prophylaxis Anticipate svd.   Sallie Staron J. 10/30/2017, 1:44 PM

## 2017-10-31 ENCOUNTER — Encounter (HOSPITAL_COMMUNITY): Payer: Self-pay

## 2017-10-31 LAB — CBC
HCT: 29.2 % — ABNORMAL LOW (ref 36.0–46.0)
Hemoglobin: 10 g/dL — ABNORMAL LOW (ref 12.0–15.0)
MCH: 29.9 pg (ref 26.0–34.0)
MCHC: 34.2 g/dL (ref 30.0–36.0)
MCV: 87.4 fL (ref 78.0–100.0)
Platelets: 142 10*3/uL — ABNORMAL LOW (ref 150–400)
RBC: 3.34 MIL/uL — ABNORMAL LOW (ref 3.87–5.11)
RDW: 14.6 % (ref 11.5–15.5)
WBC: 9.1 10*3/uL (ref 4.0–10.5)

## 2017-10-31 LAB — GLUCOSE, CAPILLARY: Glucose-Capillary: 102 mg/dL — ABNORMAL HIGH (ref 65–99)

## 2017-10-31 LAB — RPR: RPR Ser Ql: NONREACTIVE

## 2017-10-31 MED ORDER — DIPHENHYDRAMINE HCL 25 MG PO CAPS: 25.0000 mg | ORAL_CAPSULE | Freq: Four times a day (QID) | ORAL | Status: DC | PRN

## 2017-10-31 MED ORDER — SENNOSIDES-DOCUSATE SODIUM 8.6-50 MG PO TABS
2.0000 | ORAL_TABLET | ORAL | Status: DC
  Administered 2017-10-31 – 2017-11-01 (×2): 2 via ORAL
  Filled 2017-10-31 (×2): qty 2

## 2017-10-31 MED ORDER — SIMETHICONE 80 MG PO CHEW: 80.0000 mg | CHEWABLE_TABLET | ORAL | Status: DC | PRN

## 2017-10-31 MED ORDER — PRENATAL MULTIVITAMIN CH
1.0000 | ORAL_TABLET | Freq: Every day | ORAL | Status: DC
  Administered 2017-10-31 – 2017-11-01 (×2): 1 via ORAL
  Filled 2017-10-31 (×2): qty 1

## 2017-10-31 MED ORDER — DIBUCAINE 1 % RE OINT: 1.0000 "application " | TOPICAL_OINTMENT | RECTAL | Status: DC | PRN

## 2017-10-31 MED ORDER — ZOLPIDEM TARTRATE 5 MG PO TABS: 5.0000 mg | ORAL_TABLET | Freq: Every evening | ORAL | Status: DC | PRN

## 2017-10-31 MED ORDER — BENZOCAINE-MENTHOL 20-0.5 % EX AERO
1.0000 "application " | INHALATION_SPRAY | CUTANEOUS | Status: DC | PRN
  Filled 2017-10-31: qty 56

## 2017-10-31 MED ORDER — COCONUT OIL OIL: 1.0000 "application " | TOPICAL_OIL | Status: DC | PRN

## 2017-10-31 MED ORDER — TETANUS-DIPHTH-ACELL PERTUSSIS 5-2.5-18.5 LF-MCG/0.5 IM SUSP: 0.5000 mL | Freq: Once | INTRAMUSCULAR | Status: DC

## 2017-10-31 MED ORDER — IBUPROFEN 600 MG PO TABS
600.0000 mg | ORAL_TABLET | Freq: Four times a day (QID) | ORAL | Status: DC
  Administered 2017-10-31 – 2017-11-01 (×6): 600 mg via ORAL
  Filled 2017-10-31 (×6): qty 1

## 2017-10-31 MED ORDER — ONDANSETRON HCL 4 MG PO TABS: 4.0000 mg | ORAL_TABLET | ORAL | Status: DC | PRN

## 2017-10-31 MED ORDER — ACETAMINOPHEN 325 MG PO TABS
650.0000 mg | ORAL_TABLET | ORAL | Status: DC | PRN
  Administered 2017-10-31 (×2): 650 mg via ORAL
  Filled 2017-10-31 (×2): qty 2

## 2017-10-31 MED ORDER — ONDANSETRON HCL 4 MG/2ML IJ SOLN: 4.0000 mg | INTRAMUSCULAR | Status: DC | PRN

## 2017-10-31 MED ORDER — WITCH HAZEL-GLYCERIN EX PADS: 1.0000 "application " | MEDICATED_PAD | CUTANEOUS | Status: DC | PRN

## 2017-10-31 NOTE — Lactation Note (Signed)
This note was copied from a baby's chart. Lactation Consultation Note  Patient Name: Elizabeth Stephens Record XBJYN'WToday's Date: 10/31/2017 Reason for consult: Initial assessment Inf[facility] is 5717 hours old & [facility]. Infant was born at 39w and weighed 7 lbs at birth. [facility] just [facility]. Mom reports this is her 4th child and she BF the other children for 1 year for two of them and 7 months with the other and plans to BF ~4151yr with this baby. Mom asked about her milk volume and was expecting for her milk volume to have increased already and was wondering whether baby was getting any milk. Mom had asked for a DEBP so she could start pumping. Mom reports she did feel contractions when BF. Explained milk volume, stomach size, and encouraged mom to focus more on BF vs pumping because she may not pump anything now & that is normal. Mom agreed that she may not pump now then if she won't get any milk. Explained that the contractions she is feeling is a good sign that the baby is BF well since the same hormone that causes let downs causes her uterus to contract. Mom reports she has a Medela personal pump at home from her previous children. Provided mom with BF booklet, BF resources, and feeding log; mom made aware of O/P services, breastfeeding support groups, community resources, and our phone # for post-discharge questions. Mom encouraged to feed baby 8-12 times/24 hours and with feeding cues.  Mom reports no questions. Encouraged mom to ask for help as needed.   Maternal Data Does the patient have breastfeeding experience prior to this delivery?: Yes  Feeding   LATCH Score                   Interventions Interventions: Breast feeding basics reviewed  Lactation Tools Discussed/Used Pump Review: Setup, frequency, and cleaning Initiated by:: LBJ Date initiated:: 10/31/17   Consult Status Consult Status: Follow-up Date:  11/01/17 Follow-up type: In-patient    Elizabeth Stephens 10/31/2017, 5:05 PM

## 2017-10-31 NOTE — Progress Notes (Addendum)
Post Partum Day 1 Subjective: no complaints  Objective: Blood pressure 124/65, pulse 85, temperature 99 F (37.2 C), temperature source Axillary, resp. rate 18, height 5\' 6"  (1.676 m), weight 195 lb (88.5 kg), last menstrual period 02/05/2017, SpO2 100 %, unknown if currently breastfeeding.  Physical Exam:  General: alert, cooperative and appears stated age Lochia: appropriate Uterine Fundus: firm Incision:  DVT Evaluation: No evidence of DVT seen on physical exam.  Recent Labs    10/30/17 0939 10/31/17 0522  HGB 10.1* 10.0*  HCT 29.8* 29.2*    Assessment/Plan: Plan for discharge tomorrow, Breastfeeding and Circumcision prior to discharge   LOS: 1 day   Elmore GuiseLori A Clemmons CNM 10/31/2017, 10:47 AM   ADDENDUM:   Patient complaining of epidural site pain.  On exam slight erythema over epidural site, area tender to touch, n drainage, no warmth.  I advised continued pain meds use prn, heating pad use.  We discussed the pain should improve over time, otherwise to notify provider.   Patient desires circumcision of neonate.  -I discussed with patient risks, benefits and alternatives of circumcision including risks of bleeding, infection, damage to organs and need for further procedures.  All her questions were answered and she consented for the procedure to be done on her son.  Dr. Sallye OberKulwa. 10/31/2017 1627

## 2017-10-31 NOTE — Anesthesia Postprocedure Evaluation (Signed)
Anesthesia Post Note  Patient: Elizabeth Stephens  Procedure(s) Performed: AN AD HOC LABOR EPIDURAL     Patient location during evaluation: Mother Baby Anesthesia Type: Epidural Level of consciousness: awake, awake and alert, oriented and patient cooperative Pain management: satisfactory to patient Vital Signs Assessment: post-procedure vital signs reviewed and stable Respiratory status: spontaneous breathing, nonlabored ventilation and respiratory function stable Cardiovascular status: blood pressure returned to baseline and stable Postop Assessment: no headache, no backache, epidural receding, adequate PO intake, no apparent nausea or vomiting and patient able to bend at knees Anesthetic complications: no    Last Vitals:  Vitals:   10/31/17 0150 10/31/17 0305  BP: 122/67 124/65  Pulse: 83 85  Resp: 16 18  Temp: 37.4 C 37.2 C  SpO2:  100%    Last Pain:  Vitals:   10/31/17 0345  TempSrc:   PainSc: 8    Pain Goal: Patients Stated Pain Goal: 2 (10/30/17 1946)               Mauricia AreaWINN,Tayley Mudrick

## 2017-10-31 NOTE — Progress Notes (Signed)
Post Partum Day 1 s/p SVD Subjective: no complaints, up ad lib and tolerating PO  Objective: Blood pressure 124/65, pulse 85, temperature 99 F (37.2 C), temperature source Axillary, resp. rate 18, height 5\' 6"  (1.676 m), weight 88.5 kg (195 lb), last menstrual period 02/05/2017, SpO2 100 %, unknown if currently breastfeeding.  Physical Exam:  General: alert, cooperative and no distress Lochia: appropriate Uterine Fundus: firm Incision: NA DVT Evaluation: No evidence of DVT seen on physical exam.  Recent Labs    10/30/17 0939 10/31/17 0522  HGB 10.1* 10.0*  HCT 29.8* 29.2*    Assessment/Plan: Plan for discharge tomorrow and Circumcision prior to dischargeDr. Sallye OberKulwa plans to perform circumcision once the baby has urinated. Nurse informed to call dr. Sallye OberKulwa once this occurs.  Breast feeding Gestational diabetes- CBG discontinued.    LOS: 1 day   Elizabeth Stephens J. 10/31/2017, 9:56 AM

## 2017-11-01 NOTE — Discharge Summary (Signed)
OB Discharge Summary     Patient Name: Elizabeth Stephens DOB: 09/10/1976 MRN: [id number]  Date of admission: 10/30/2017 Delivering MD: Kenney HousemanPROTHERO, Shakisha Abend JEAN   Date of discharge: 11/01/2017  Admitting diagnosis: INDUCTION Intrauterine pregnancy: 5847w0d     Secondary diagnosis:  Active Problems:   Gestational diabetes mellitus   SVD (spontaneous vaginal delivery)  Additional problems: None     Discharge diagnosis: Term Pregnancy Delivered                                                                                                Post partum procedures:None  Augmentation: Pitocin  Complications: None  Hospital course:  Induction of Labor With Vaginal Delivery   41 y.o. yo [id number] at 5147w0d was admitted to the hospital 10/30/2017 for induction of labor.  Indication for induction: A2 DM.  Patient had an uncomplicated labor course as follows: Membrane Rupture Time/Date: 9:27 PM ,10/30/2017   Intrapartum Procedures: Episiotomy: None [1]                                         Lacerations:  None [1]  Patient had delivery of a Viable infant.  Information for the patient's newborn:  Nash Mantisjetunmobi, Boy Aleia [[id number]][[id number]]      10/30/2017  Details of delivery can be found in separate delivery note.  Patient had a routine postpartum course. Patient is discharged home 11/01/17.  Physical exam  Vitals:   10/31/17 0150 10/31/17 0305 10/31/17 1740 11/01/17 0539  BP: 122/67 124/65 130/78 130/75  Pulse: 83 85 82 65  Resp: 16 18 18 18   Temp: 99.3 F (37.4 C) 99 F (37.2 C) 98.6 F (37 C) 98.4 F (36.9 C)  TempSrc: Axillary Axillary    SpO2:  100% 100% 99%  Weight:      Height:       General: alert, cooperative and no distress Lochia: appropriate Uterine Fundus: firm Incision: N/A DVT Evaluation: No evidence of DVT seen on physical exam. Negative Homan's sign. Labs: Lab Results  Component Value Date   WBC 9.1 10/31/2017   HGB 10.0 (L) 10/31/2017   HCT 29.2 (L)  10/31/2017   MCV 87.4 10/31/2017   PLT 142 (L) 10/31/2017   CMP Latest Ref Rng & Units 11/17/2008  Glucose 70 - 99 mg/dL 932(T110(H)  BUN 6 - 23 mg/dL 5(L)  Creatinine 0.4 - 1.2 mg/dL 5.570.45  Sodium 322135 - 025145 mEq/L 132(L)  Potassium 3.5 - 5.1 mEq/L 4.0  Chloride 96 - 112 mEq/L 100  CO2 19 - 32 mEq/L 24  Calcium 8.4 - 10.5 mg/dL 9.2  Total Protein - -  Total Bilirubin - -  Alkaline Phos - -  AST - -  ALT - -    Discharge instruction: per After Visit Summary and "Baby and Me Booklet".  After visit meds:  PNV Ibuprofen  Diet: routine diet  Activity: Advance as tolerated. Pelvic rest for 6 weeks.   Outpatient follow up:6 weeks Follow up Appt:No future appointments. Follow  up Visit:No Follow-up on file.  Postpartum contraception: Tubal Ligation  Newborn Data: Live born female  Birth Weight: 7 lb (3175 g) APGAR: 9, 9  Newborn Delivery   Birth date/time:  10/30/2017 23:09:00 Delivery type:  Vaginal, Spontaneous     Baby Feeding: Breast Disposition:home with mother   11/01/2017 Kenney HousemanNancy Jean Heraclio Seidman, CNM

## 2017-11-01 NOTE — Lactation Note (Signed)
This note was copied from a baby's chart. Lactation Consultation Note  Patient Name: Elizabeth Stephens FAOZH'YToday's Date: 11/01/2017 Reason for consult: Follow-up assessment;Infant weight loss(3% weigjt loss ) Per mom was concerned baby wasn't getting enough so supplemented once during the  Night and this am. Per mom breast are filling. LC stressed the importance of holding off  On supplementing formula, and every feeding have baby feed long enough to soften the 1st  Breast and offer the 2nd. LC discussed cluster feeding and the importance of offering The breast with feeding cues and by 3 hours to decrease cluster feeding intervals.  LC also recommended prior to latching - breast massage, hand express,pre-pump if needed,  Latch and breast compressions with feedings to enhance volume going to baby each shift.  Sore nipples and engorgement prevention and tx. Reviewed. Mother informed of post-discharge support and given phone number to the lactation department, including services for phone call assistance; out-patient appointments; and breastfeeding support group. List of other breastfeeding resources in the community given in the handout. Encouraged mother to call for problems or concerns related to breastfeeding.   Maternal Data    Feeding Feeding Type: (per momlast feeding around 1000 bottle ) Nipple Type: Slow - flow  LATCH Score                   Interventions Interventions: Breast feeding basics reviewed  Lactation Tools Discussed/Used Tools: Pump Breast pump type: Double-Electric Breast Pump WIC Program: No   Consult Status Consult Status: Complete Date: 11/01/17    Elizabeth Stephens 11/01/2017, 12:30 PM

## 2017-11-12 DIAGNOSIS — I10 Essential (primary) hypertension: Secondary | ICD-10-CM | POA: Diagnosis not present

## 2017-12-25 DIAGNOSIS — Z308 Encounter for other contraceptive management: Secondary | ICD-10-CM | POA: Diagnosis not present

## 2018-03-19 DIAGNOSIS — Z30430 Encounter for insertion of intrauterine contraceptive device: Secondary | ICD-10-CM | POA: Diagnosis not present

## 2018-07-02 IMAGING — US US MFM OB DETAIL+14 WK
1 series · 14 of 28 positions shown · non-contrast
Comparison: none

[Series 1: us mfm ob detail+14 wk · 73 acquisitions, 14 frames shown]
[im 3/73]
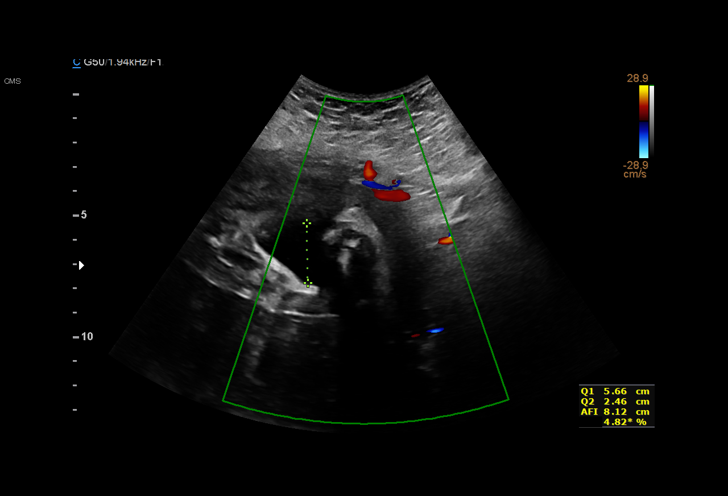
[im 9/73]
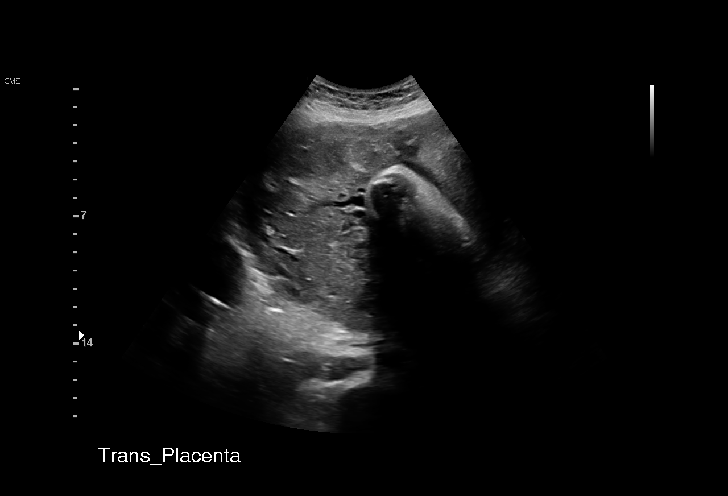
[im 14/73]
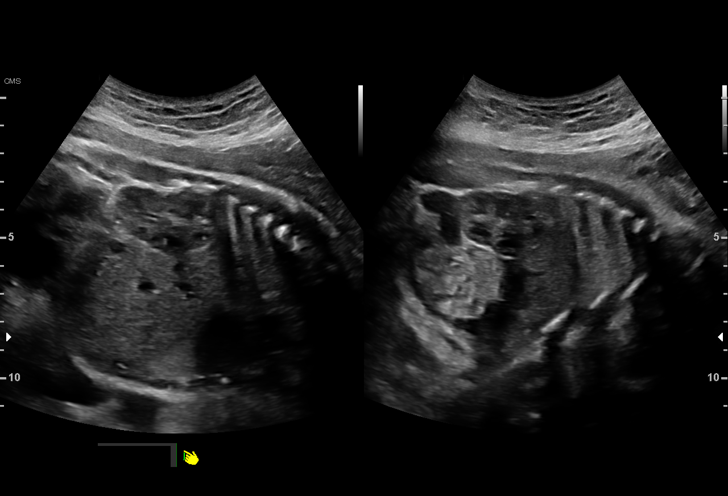
[im 19/73]
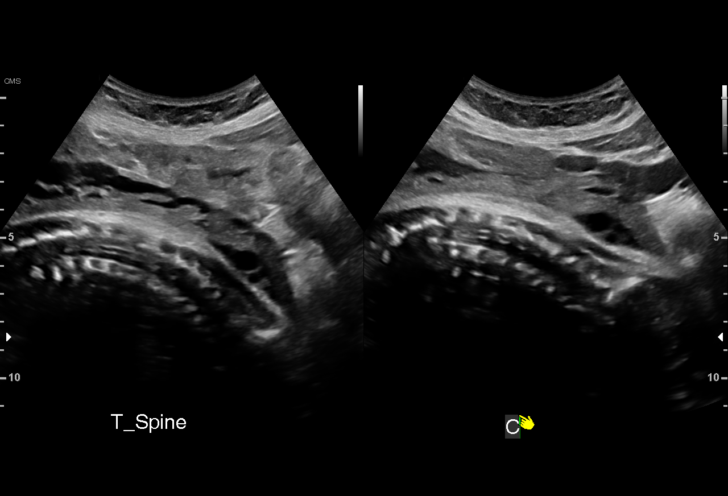
[im 25/73]
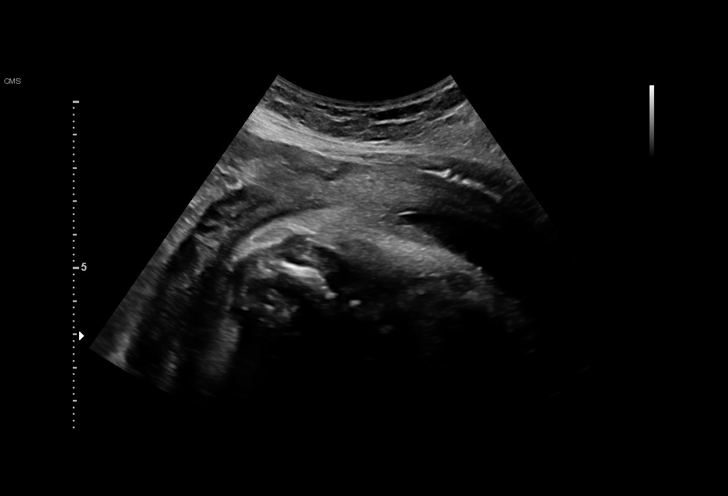
[im 30/73]
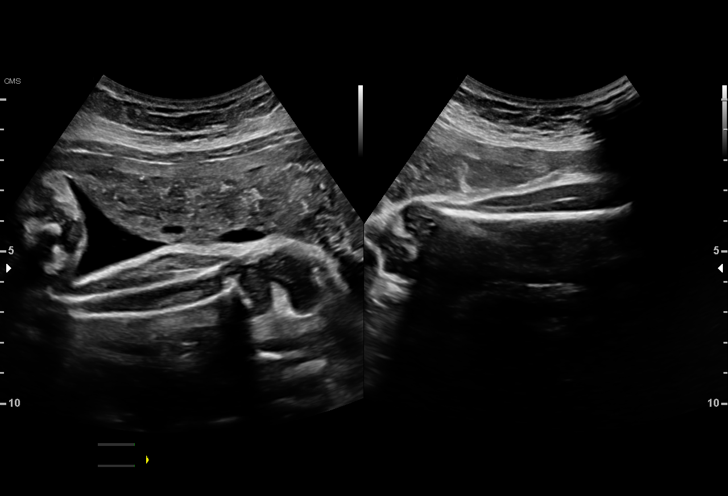
[im 35/73]
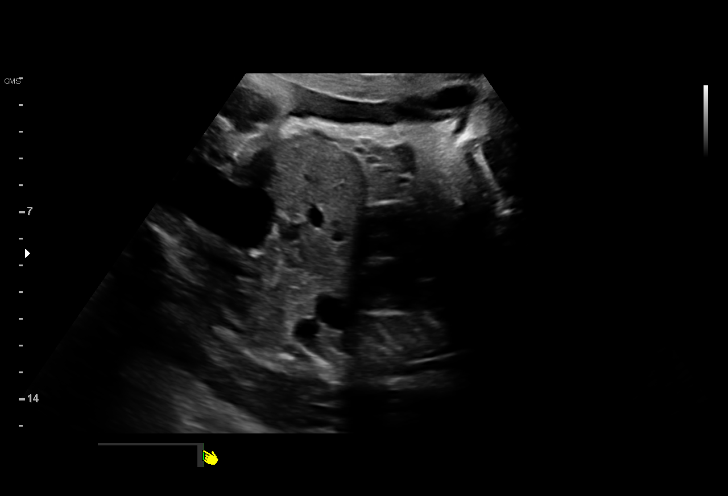
[im 41/73]
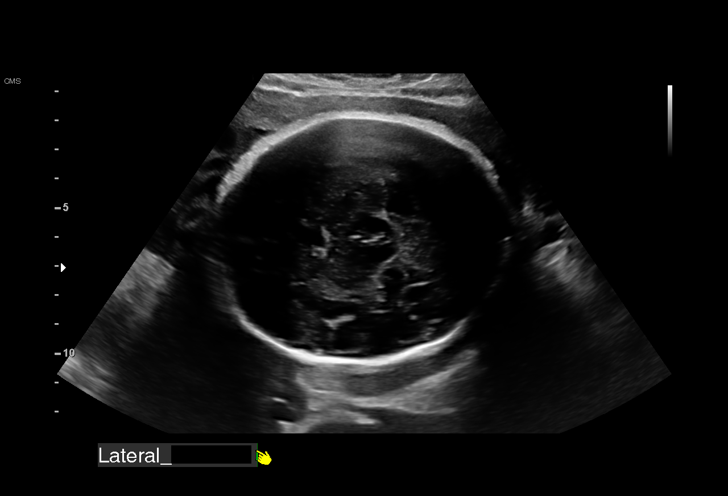
[im 46/73]
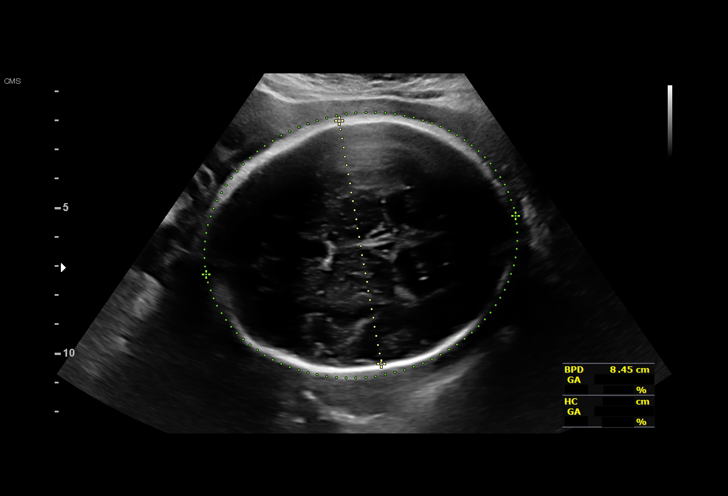
[im 51/73]
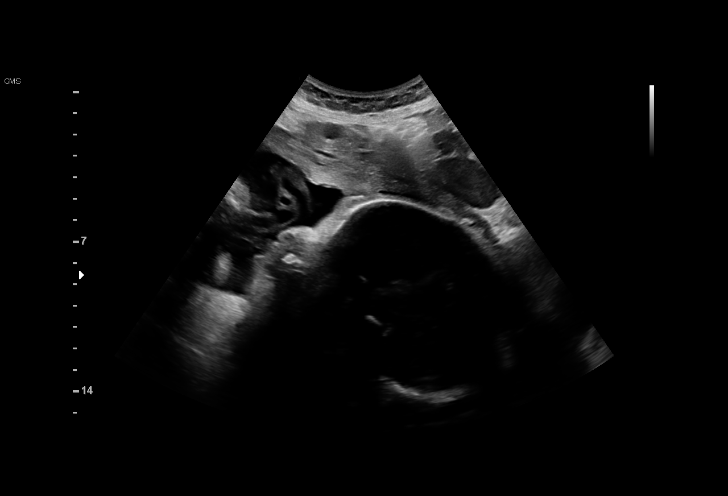
[im 57/73]
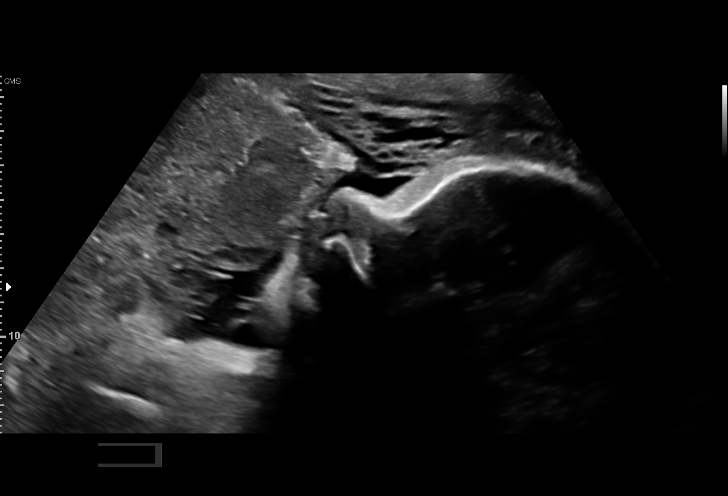
[im 62/73]
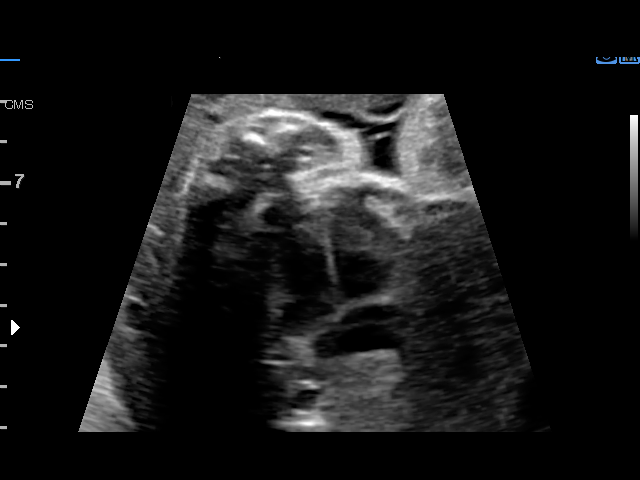
[im 67/73]
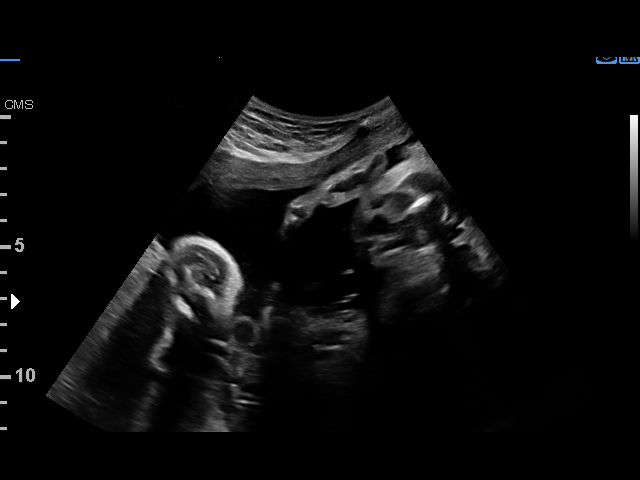
[im 73/73]
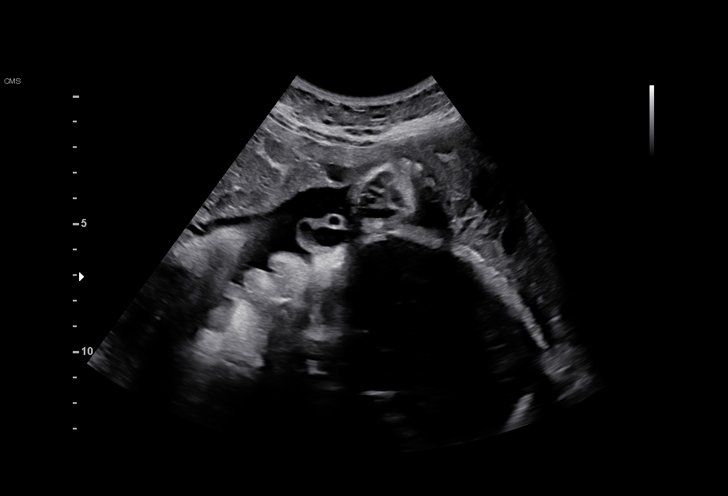

[14 of 28 positions shown; findings below may reference images not displayed]

AJETUNMOBI

[REDACTED]
Ave., [HOSPITAL]

1  OB STYLES                [id number]      [id number]     [id number]
2  OB STYLES                [id number]      [id number]     [id number]
Indications

33 weeks gestation of pregnancy
Encounter for fetal anatomic survey
Advanced maternal age multigravida 35+,
third trimester (neg QUAD)
Gestational diabetes in pregnancy,
controlled by oral hypoglycemic drugs
(glyburide)
OB History

Gravidity:    6         Term:   3        Prem:   0        SAB:   2
TOP:          0       Ectopic:  0        Living: 3
Fetal Evaluation

Num Of Fetuses:     1
Fetal Heart         130
Rate(bpm):
Cardiac Activity:   Observed
Presentation:       Cephalic
Placenta:           Posterior, above cervical os
P. Cord Insertion:  Not well visualized

Amniotic Fluid
AFI FV:      Subjectively within normal limits

AFI Sum(cm)     %Tile       Largest Pocket(cm)
11.56           30
RUQ(cm)                     LUQ(cm)        LLQ(cm)
5.66
Biophysical Evaluation

Amniotic F.V:   Within normal limits       F. Tone:        Observed
F. Movement:    Observed                   Score:          [DATE]
F. Breathing:   Observed
Biometry

BPD:      83.9  mm     G. Age:  33w 5d         52  %    CI:        72.86   %    70 - 86
FL/HC:      20.0   %    19.4 -
HC:      312.5  mm     G. Age:  35w 0d         50  %    HC/AC:      1.02        0.96 -
AC:      305.8  mm     G. Age:  34w 4d         78  %    FL/BPD:     74.5   %    71 - 87
FL:       62.5  mm     G. Age:  32w 2d         14  %    FL/AC:      20.4   %    20 - 24
HUM:      57.5  mm     G. Age:  33w 2d         54  %

Est. FW:    0396  gm      5 lb 1 oz     65  %
Gestational Age

LMP:           32w 5d        Date:  02/05/17                 EDD:   11/12/17
U/S Today:     33w 6d                                        EDD:   11/04/17
Best:          33w 4d     Det. By:  Early Ultrasound         EDD:   11/06/17
(04/08/17)
Anatomy

Cranium:               Appears normal         Aortic Arch:            Not well visualized
Cavum:                 Appears normal         Ductal Arch:            Not well visualized
Ventricles:            Appears normal         Diaphragm:              Appears normal
Choroid Plexus:        Appears normal         Stomach:                Appears normal, left
sided
Cerebellum:            Appears normal         Abdomen:                Appears normal
Posterior Fossa:       Appears normal         Abdominal Wall:         Not well visualized
Nuchal Fold:           Not applicable (>20    Cord Vessels:           Appears normal (3
wks GA)                                        vessel cord)
Face:                  Appears normal         Kidneys:                Appear normal
(orbits and profile)
Lips:                  Appears normal         Bladder:                Appears normal
Thoracic:              Appears normal         Spine:                  Limited views
appear normal
Heart:                 Appears normal         Upper Extremities:      Appears normal
(4CH, axis, and
situs)
RVOT:                  Appears normal         Lower Extremities:      Appears normal
LVOT:                  Appears normal

Other:  Fetus appears to be a male. Right 5th digit/open hand visualized.
Technicallly difficult due to advanced GA and maternal habitus.
Cervix Uterus Adnexa

Cervix
Not visualized (advanced GA >16wks)

Uterus
No abnormality visualized.
Left Ovary
Not visualized.

Right Ovary
Not visualized.

Cul De Sac:   No free fluid seen.

Adnexa:       No abnormality visualized.
Impression

Single living intrauterine pregnancy at 33 weeks 4 days.
Appropriate interval fetal growth (65%).
Normal amniotic fluid volume.
Normal interval fetal anatomy.
BPP [DATE].
Recommendations

Recommend weekly antenatal testing due to class A2
gestational diabetes.
If you wish this testing be performed in the [facility] please call
to schedule.

## 2019-01-04 DIAGNOSIS — H109 Unspecified conjunctivitis: Secondary | ICD-10-CM | POA: Diagnosis not present

## 2019-01-04 DIAGNOSIS — H0011 Chalazion right upper eyelid: Secondary | ICD-10-CM | POA: Diagnosis not present

## 2019-01-06 DIAGNOSIS — H01009 Unspecified blepharitis unspecified eye, unspecified eyelid: Secondary | ICD-10-CM | POA: Diagnosis not present

## 2019-06-07 DIAGNOSIS — Z8632 Personal history of gestational diabetes: Secondary | ICD-10-CM | POA: Diagnosis not present

## 2019-06-07 DIAGNOSIS — N939 Abnormal uterine and vaginal bleeding, unspecified: Secondary | ICD-10-CM | POA: Diagnosis not present

## 2019-06-09 DIAGNOSIS — N939 Abnormal uterine and vaginal bleeding, unspecified: Secondary | ICD-10-CM | POA: Diagnosis not present

## 2019-09-19 DIAGNOSIS — Z8632 Personal history of gestational diabetes: Secondary | ICD-10-CM | POA: Diagnosis not present

## 2019-09-19 DIAGNOSIS — R635 Abnormal weight gain: Secondary | ICD-10-CM | POA: Diagnosis not present

## 2019-09-19 DIAGNOSIS — E639 Nutritional deficiency, unspecified: Secondary | ICD-10-CM | POA: Diagnosis not present

## 2019-09-19 DIAGNOSIS — R030 Elevated blood-pressure reading, without diagnosis of hypertension: Secondary | ICD-10-CM | POA: Diagnosis not present

## 2019-09-19 DIAGNOSIS — Z833 Family history of diabetes mellitus: Secondary | ICD-10-CM | POA: Diagnosis not present

## 2019-09-22 DIAGNOSIS — I10 Essential (primary) hypertension: Secondary | ICD-10-CM | POA: Diagnosis not present

## 2019-09-22 DIAGNOSIS — E639 Nutritional deficiency, unspecified: Secondary | ICD-10-CM | POA: Diagnosis not present

## 2019-09-22 DIAGNOSIS — Z8632 Personal history of gestational diabetes: Secondary | ICD-10-CM | POA: Diagnosis not present

## 2019-09-22 DIAGNOSIS — Z833 Family history of diabetes mellitus: Secondary | ICD-10-CM | POA: Diagnosis not present

## 2019-09-22 DIAGNOSIS — R635 Abnormal weight gain: Secondary | ICD-10-CM | POA: Diagnosis not present

## 2019-09-22 DIAGNOSIS — R030 Elevated blood-pressure reading, without diagnosis of hypertension: Secondary | ICD-10-CM | POA: Diagnosis not present

## 2019-10-24 DIAGNOSIS — E663 Overweight: Secondary | ICD-10-CM | POA: Diagnosis not present

## 2019-10-24 DIAGNOSIS — I10 Essential (primary) hypertension: Secondary | ICD-10-CM | POA: Diagnosis not present

## 2019-10-24 DIAGNOSIS — E119 Type 2 diabetes mellitus without complications: Secondary | ICD-10-CM | POA: Diagnosis not present

## 2019-12-06 DIAGNOSIS — U071 COVID-19: Secondary | ICD-10-CM | POA: Diagnosis not present

## 2019-12-06 DIAGNOSIS — R05 Cough: Secondary | ICD-10-CM | POA: Diagnosis not present

## 2020-04-26 DIAGNOSIS — R1084 Generalized abdominal pain: Secondary | ICD-10-CM | POA: Diagnosis not present

## 2020-10-17 ENCOUNTER — Other Ambulatory Visit: Payer: Self-pay | Admitting: Family Medicine

## 2020-10-27 DIAGNOSIS — Z20822 Contact with and (suspected) exposure to covid-19: Secondary | ICD-10-CM | POA: Diagnosis not present

## 2020-11-20 ENCOUNTER — Other Ambulatory Visit: Payer: Self-pay | Admitting: Family Medicine

## 2020-11-20 DIAGNOSIS — E663 Overweight: Secondary | ICD-10-CM | POA: Diagnosis not present

## 2020-11-20 DIAGNOSIS — Z833 Family history of diabetes mellitus: Secondary | ICD-10-CM | POA: Diagnosis not present

## 2020-11-20 DIAGNOSIS — I10 Essential (primary) hypertension: Secondary | ICD-10-CM | POA: Diagnosis not present

## 2020-11-20 DIAGNOSIS — Z566 Other physical and mental strain related to work: Secondary | ICD-10-CM | POA: Diagnosis not present

## 2020-11-20 DIAGNOSIS — Z7984 Long term (current) use of oral hypoglycemic drugs: Secondary | ICD-10-CM | POA: Diagnosis not present

## 2020-11-20 DIAGNOSIS — E119 Type 2 diabetes mellitus without complications: Secondary | ICD-10-CM | POA: Diagnosis not present

## 2020-11-20 DIAGNOSIS — Z13220 Encounter for screening for lipoid disorders: Secondary | ICD-10-CM | POA: Diagnosis not present

## 2020-11-20 DIAGNOSIS — Z8632 Personal history of gestational diabetes: Secondary | ICD-10-CM | POA: Diagnosis not present

## 2021-01-19 ENCOUNTER — Encounter (HOSPITAL_COMMUNITY): Payer: Self-pay | Admitting: Emergency Medicine

## 2021-01-19 ENCOUNTER — Other Ambulatory Visit: Payer: Self-pay

## 2021-01-19 ENCOUNTER — Emergency Department (HOSPITAL_COMMUNITY)
Admission: EM | Admit: 2021-01-19 | Discharge: 2021-01-20 | Disposition: A | Discharge status: Home / Self Care | Payer: 59 | Attending: Emergency Medicine | Admitting: Emergency Medicine

## 2021-01-19 DIAGNOSIS — G44309 Post-traumatic headache, unspecified, not intractable: Secondary | ICD-10-CM

## 2021-01-19 DIAGNOSIS — G44329 Chronic post-traumatic headache, not intractable: Secondary | ICD-10-CM | POA: Diagnosis not present

## 2021-01-19 DIAGNOSIS — I158 Other secondary hypertension: Secondary | ICD-10-CM | POA: Diagnosis not present

## 2021-01-19 DIAGNOSIS — R519 Headache, unspecified: Secondary | ICD-10-CM | POA: Diagnosis not present

## 2021-01-19 MED ORDER — ACETAMINOPHEN 500 MG PO TABS
1000.0000 mg | ORAL_TABLET | Freq: Once | ORAL | Status: AC
  Administered 2021-01-20: 1000 mg via ORAL
  Filled 2021-01-19: qty 2

## 2021-01-19 NOTE — ED Triage Notes (Signed)
Patient is complaining of a fall that happened two weeks ago. Patient is not complaining of pain at this moment. Patient states that she has been having a lot of headaches and bp has been high. She came tonight to see what is going on.

## 2021-01-19 NOTE — ED Provider Notes (Signed)
[facility]-EMERGENCY DEPT Provider Note  CSN: [id number] Arrival date & time: 01/19/21 2237  Chief Complaint(s) Fall and Headache  HPI Elizabeth Stephens is a 45 y.o. female with a history of diabetes who presents to the emergency departments with posterior headache that has been intermittent since a mechanical fall numerous weeks ago after she slipped on ice causing her to fall backwards.  She reports hitting the back of her head then.  She denies any loss of consciousness at that time.  Reports she was able to get up and go to work.  Since then she has been having intermittent headaches.  Headache over the past several days have been worse and decided to check her blood pressure and noted that it was elevated.  Patient was placed on lisinopril by her PCP recently.  Reports that her blood pressures normally run in the 130s.  She noted that her blood pressures today were in the 160s.  Currently her headache is gone.  Reports that at times is exacerbated with movement or stress.  No fevers or chills.  No focal deficits.  No other physical complaints.  HPI  Past Medical History Past Medical History:  Diagnosis Date  . Gestational diabetes    glyburide   Patient Active Problem List   Diagnosis Date Noted  . Gestational diabetes mellitus 10/30/2017  . SVD (spontaneous vaginal delivery) 10/30/2017  . Impaired glucose tolerance test 08/31/2017  . Missed abortion 06/01/2013   Home Medication(s) Prior to Admission medications   Medication Sig Start Date End Date Taking? Authorizing Provider  Ascorbic Acid (VITAMIN C) 1000 MG tablet Take 1,000 mg by mouth daily.    [provider]  ibuprofen (ADVIL,MOTRIN) 200 MG tablet Take 600-800 mg by mouth every 6 (six) hours as needed for mild pain or cramping.    [provider]  Prenatal Vit-Fe Fumarate-FA (PRENATAL VITAMIN PO) Take 1 tablet by mouth daily.     [provider]                                                                                                                                     Past Surgical History Past Surgical History:  Procedure Laterality Date  . DILATION AND EVACUATION N/A 06/01/2013   Procedure: DILATATION AND EVACUATION, ultrasound guided;  Surgeon: Dorien Chihuahua. Richardson Dopp, MD;  Location: [facility];  Service: Gynecology;  Laterality: N/A;   Family History Family History  Problem Relation Age of Onset  . Arthritis Mother   . Hypertension Father   . Diabetes Father     Social History Social History   Tobacco Use  . Smoking status: Never Smoker  . Smokeless tobacco: Never Used  Substance Use Topics  . Alcohol use: No  . Drug use: No   Allergies Patient has no known allergies.  Review of Systems Review of Systems All other systems are reviewed and are negative for acute  change except as noted in the HPI  Physical Exam Vital Signs  I have reviewed the triage vital signs BP (!) 152/98 (BP Location: Left Arm)   Pulse 92   Temp 98.9 F (37.2 C) (Oral)   Resp 19   Ht 5\' 6"  (1.676 m)   Wt 78.9 kg   LMP 01/16/2021   SpO2 100%   BMI 28.08 kg/m   Physical Exam Vitals reviewed.  Constitutional:      General: She is not in acute distress.    Appearance: She is well-developed and well-nourished. She is not diaphoretic.  HENT:     Head: Normocephalic and atraumatic.      Right Ear: External ear normal.     Left Ear: External ear normal.     Nose: Nose normal.  Eyes:     General: No scleral icterus.    Extraocular Movements: EOM normal.     Conjunctiva/sclera: Conjunctivae normal.  Neck:     Trachea: Phonation normal.  Cardiovascular:     Rate and Rhythm: Normal rate and regular rhythm.  Pulmonary:     Effort: Pulmonary effort is normal. No respiratory distress.     Breath sounds: No stridor.  Abdominal:     General: There is no distension.  Musculoskeletal:        General: No edema. Normal range of motion.     Cervical back: Normal range  of motion.  Neurological:     Mental Status: She is alert and oriented to person, place, and time.  Psychiatric:        Mood and Affect: Mood and affect normal.        Behavior: Behavior normal.     ED Results and Treatments Labs (all labs ordered are listed, but only abnormal results are displayed) Labs Reviewed - No data to display                                                                                                                       EKG  EKG Interpretation  Date/Time:    Ventricular Rate:    PR Interval:    QRS Duration:   QT Interval:    QTC Calculation:   R Axis:     Text Interpretation:        Radiology No results found.  Pertinent labs & imaging results that were available during my care of the patient were reviewed by me and considered in my medical decision making (see chart for details).  Medications Ordered in ED Medications  acetaminophen (TYLENOL) tablet 1,000 mg (1,000 mg Oral Given 01/20/21 0006)  Procedures Procedures  (including critical care time)  Medical Decision Making / ED Course I have reviewed the nursing notes for this encounter and the patient's prior records (if available in EHR or on provided paperwork).   [name] was evaluated in Emergency Department on 01/20/2021 for the symptoms described in the history of present illness. She was evaluated in the context of the global COVID-19 pandemic, which necessitated consideration that the patient might be at risk for infection with the SARS-CoV-2 virus that causes COVID-19. Institutional protocols and algorithms that pertain to the evaluation of patients at risk for COVID-19 are in a state of rapid change based on information released by regulatory bodies including the CDC and federal and state organizations. These policies and algorithms were  followed during the patient's care in the ED.  Right occipital headache. Intermittent. Non focal neuro exam. No fever. Doubt meningitis. Doubt intracranial bleed. Doubt IIH. No indication for imaging.  Provided with Tylenol.  Patient does have HTN with sbp at 150s. No symptoms concerning for endorgan damage. BP down to 120s w/o intervention. May be related to anxiety or pain.        Final Clinical Impression(s) / ED Diagnoses Final diagnoses:  Intermittent post-traumatic headache  Other secondary hypertension   The patient appears reasonably screened and/or stabilized for discharge and I doubt any other medical condition or other [facility] requiring further screening, evaluation, or treatment in the ED at this time prior to discharge. Safe for discharge with strict return precautions.  Disposition: Discharge  Condition: Good  I have discussed the results, Dx and Tx plan with the patient/family who expressed understanding and agree(s) with the plan. Discharge instructions discussed at length. The patient/family was given strict return precautions who verbalized understanding of the instructions. No further questions at time of discharge.    ED Discharge Orders    None       Follow Up: Primary care provider  Schedule an appointment as soon as possible for a visit        This chart was dictated using voice recognition software.  Despite best efforts to proofread,  errors can occur which can change the documentation meaning.   Nira Conn, MD 01/20/21 [phone]

## 2021-02-06 ENCOUNTER — Ambulatory Visit (HOSPITAL_COMMUNITY)
Admission: EM | Admit: 2021-02-06 | Discharge: 2021-02-06 | Disposition: A | Discharge status: Home / Self Care | Payer: 59 | Attending: Emergency Medicine | Admitting: Emergency Medicine

## 2021-02-06 ENCOUNTER — Encounter (HOSPITAL_COMMUNITY): Payer: Self-pay

## 2021-02-06 ENCOUNTER — Other Ambulatory Visit: Payer: Self-pay

## 2021-02-06 DIAGNOSIS — R197 Diarrhea, unspecified: Secondary | ICD-10-CM | POA: Diagnosis not present

## 2021-02-06 DIAGNOSIS — R42 Dizziness and giddiness: Secondary | ICD-10-CM

## 2021-02-06 DIAGNOSIS — R112 Nausea with vomiting, unspecified: Secondary | ICD-10-CM | POA: Diagnosis not present

## 2021-02-06 LAB — CBC
HCT: 42.2 % (ref 36.0–46.0)
Hemoglobin: 13.5 g/dL (ref 12.0–15.0)
MCH: 26 pg (ref 26.0–34.0)
MCHC: 32 g/dL (ref 30.0–36.0)
MCV: 81.2 fL (ref 80.0–100.0)
Platelets: 254 10*3/uL (ref 150–400)
RBC: 5.2 MIL/uL — ABNORMAL HIGH (ref 3.87–5.11)
RDW: 14.2 % (ref 11.5–15.5)
WBC: 5.3 10*3/uL (ref 4.0–10.5)
nRBC: 0 % (ref 0.0–0.2)

## 2021-02-06 LAB — COMPREHENSIVE METABOLIC PANEL
ALT: 22 U/L (ref 0–44)
AST: 24 U/L (ref 15–41)
Albumin: 4.3 g/dL (ref 3.5–5.0)
Alkaline Phosphatase: 86 U/L (ref 38–126)
Anion gap: 10 (ref 5–15)
BUN: 9 mg/dL (ref 6–20)
CO2: 20 mmol/L — ABNORMAL LOW (ref 22–32)
Calcium: 9.2 mg/dL (ref 8.9–10.3)
Chloride: 104 mmol/L (ref 98–111)
Creatinine, Ser: 0.73 mg/dL (ref 0.44–1.00)
GFR, Estimated: >60 mL/min (ref >60)
Glucose, Bld: 104 mg/dL — ABNORMAL HIGH (ref 70–99)
Potassium: 3.4 mmol/L — ABNORMAL LOW (ref 3.5–5.1)
Sodium: 134 mmol/L — ABNORMAL LOW (ref 135–145)
Total Bilirubin: 0.7 mg/dL (ref 0.3–1.2)
Total Protein: 8.8 g/dL — ABNORMAL HIGH (ref 6.5–8.1)

## 2021-02-06 MED ORDER — ONDANSETRON 4 MG PO TBDP: 4.0000 mg | ORAL_TABLET | Freq: Three times a day (TID) | ORAL | 0 refills | Status: AC | PRN

## 2021-02-06 MED ORDER — ONDANSETRON 4 MG PO TBDP
4.0000 mg | ORAL_TABLET | Freq: Once | ORAL | Status: AC
  Administered 2021-02-06: 4 mg via ORAL

## 2021-02-06 MED ORDER — ONDANSETRON 4 MG PO TBDP
ORAL_TABLET | ORAL | Status: AC
  Filled 2021-02-06: qty 1

## 2021-02-06 NOTE — Discharge Instructions (Addendum)
Take the antinausea medication as directed.    Keep yourself hydrated with clear liquids, such as water, Gatorade, Pedialyte, Sprite, or ginger ale.    Go to the emergency department if you have acute worsening symptoms.    Follow up with your primary care provider if your symptoms are not improving.      

## 2021-02-06 NOTE — ED Triage Notes (Signed)
Pt presents with N/V/D and dizziness since waking up yesterday.

## 2021-02-06 NOTE — ED Provider Notes (Incomplete Revision)
[facility]    CSN: [id number] Arrival date & time: 02/06/21  1331      History   Chief Complaint Chief Complaint  Patient presents with  . Nausea  . Emesis  . Diarrhea  . Dizziness    HPI Elizabeth Stephens is a 45 y.o. female.   Patient presents with nausea, vomiting, diarrhea, and dizziness x2 days.  Her symptoms started on 02/04/2021 after eating at [facility] fried chicken.  She thinks she has food poisoning.  She denies fever, or other symptoms.  Last emesis yesterday.  5 episodes of diarrhea today.  She states her stool is liquid "like water."  Patient has been able to eat toast today without emesis.  She requests testing for intestinal illnesses such as Salmonella.  She also requests blood work.  No treatments attempted at home.  Her medical history includes gestational diabetes.  The history is provided by the patient and medical records.    Past Medical History:  Diagnosis Date  . Gestational diabetes    glyburide    Patient Active Problem List   Diagnosis Date Noted  . Gestational diabetes mellitus 10/30/2017  . SVD (spontaneous vaginal delivery) 10/30/2017  . Impaired glucose tolerance test 08/31/2017  . Missed abortion 06/01/2013    Past Surgical History:  Procedure Laterality Date  . DILATION AND EVACUATION N/A 06/01/2013   Procedure: DILATATION AND EVACUATION, ultrasound guided;  Surgeon: Dorien Chihuahua. Richardson Dopp, MD;  Location: [facility];  Service: Gynecology;  Laterality: N/A;    OB History    Gravida  6   Para  4   Term  4   Preterm      AB  2   Living  4     SAB  2   IAB      Ectopic      Multiple  0   Live Births  4            Home Medications    Prior to Admission medications   Medication Sig Start Date End Date Taking? Authorizing Provider  ondansetron (ZOFRAN ODT) 4 MG disintegrating tablet Take 1 tablet (4 mg total) by mouth every 8 (eight) hours as needed for nausea or vomiting. 02/06/21  Yes Mickie Bail, NP   Ascorbic Acid (VITAMIN C) 1000 MG tablet Take 1,000 mg by mouth daily.    [provider]  ibuprofen (ADVIL,MOTRIN) 200 MG tablet Take 600-800 mg by mouth every 6 (six) hours as needed for mild pain or cramping.    [provider]  Prenatal Vit-Fe Fumarate-FA (PRENATAL VITAMIN PO) Take 1 tablet by mouth daily.     [provider]    Family History Family History  Problem Relation Age of Onset  . Arthritis Mother   . Hypertension Father   . Diabetes Father     Social History Social History   Tobacco Use  . Smoking status: Never Smoker  . Smokeless tobacco: Never Used  Substance Use Topics  . Alcohol use: No  . Drug use: No     Allergies   Patient has no known allergies.   Review of Systems Review of Systems  Constitutional: Negative for chills and fever.  HENT: Negative for ear pain and sore throat.   Eyes: Negative for pain and visual disturbance.  Respiratory: Negative for cough and shortness of breath.   Cardiovascular: Negative for chest pain and palpitations.  Gastrointestinal: Positive for diarrhea, nausea and vomiting. Negative for abdominal pain.  Genitourinary:  Negative for dysuria and hematuria.  Musculoskeletal: Negative for arthralgias and back pain.  Skin: Negative for color change and rash.  Neurological: Positive for dizziness. Negative for syncope, weakness, numbness and headaches.  All other systems reviewed and are negative.    Physical Exam Triage Vital Signs ED Triage Vitals  Enc Vitals Group     BP      Pulse      Resp      Temp      Temp src      SpO2      Weight      Height      Head Circumference      Peak Flow      Pain Score      Pain Loc      Pain Edu?      Excl. in GC?    Orthostatic VS for the past 24 hrs:  BP- Lying Pulse- Lying BP- Sitting Pulse- Sitting BP- Standing at 0 minutes Pulse- Standing at 0 minutes  02/06/21 1651 109/73 98 122/81 102 121/71 113    Updated Vital Signs BP 114/81  (BP Location: Right Arm)   Pulse 100   Temp 98.4 F (36.9 C) (Oral)   Resp 17   LMP 01/16/2021   SpO2 100%   Visual Acuity Right Eye Distance:   Left Eye Distance:   Bilateral Distance:    Right Eye Near:   Left Eye Near:    Bilateral Near:     Physical Exam Vitals and nursing note reviewed.  Constitutional:      General: She is not in acute distress.    Appearance: She is well-developed.  HENT:     Head: Normocephalic and atraumatic.     Mouth/Throat:     Mouth: Mucous membranes are moist.  Eyes:     Conjunctiva/sclera: Conjunctivae normal.  Cardiovascular:     Rate and Rhythm: Normal rate and regular rhythm.     Heart sounds: Normal heart sounds.  Pulmonary:     Effort: Pulmonary effort is normal. No respiratory distress.     Breath sounds: Normal breath sounds.  Abdominal:     General: Bowel sounds are increased. There is no distension.     Palpations: Abdomen is soft.     Tenderness: There is no abdominal tenderness. There is no guarding or rebound.  Musculoskeletal:     Cervical back: Neck supple.  Skin:    General: Skin is warm and dry.  Neurological:     General: No focal deficit present.     Mental Status: She is alert and oriented to person, place, and time.     Gait: Gait normal.  Psychiatric:        Mood and Affect: Mood normal.        Behavior: Behavior normal.      UC Treatments / Results  Labs (all labs ordered are listed, but only abnormal results are displayed) Labs Reviewed  GASTROINTESTINAL PANEL BY PCR, STOOL (REPLACES STOOL CULTURE)  CBC  COMPREHENSIVE METABOLIC PANEL    EKG   Radiology No results found.  Procedures Procedures (including critical care time)  Medications Ordered in UC Medications  ondansetron (ZOFRAN-ODT) disintegrating tablet 4 mg (4 mg Oral Given 02/06/21 1650)    Initial Impression / Assessment and Plan / UC Course  I have reviewed the triage vital signs and the nursing notes.  Pertinent labs &  imaging results that were available during my care of the patient were reviewed  by me and considered in my medical decision making (see chart for details).   Nausea, vomiting, diarrhea, dizziness.  Zofran given here and patient able to consume liquids without emesis.  Stool for GI panel pending.  CBC and CMP pending.  Instructed patient to keep yourself hydrated with clear liquids.  Zofran as needed for nausea or vomiting.  ED precautions discussed.  Instructed her to follow-up with her PCP if her symptoms are not improving.  She agrees to plan of care.   Final Clinical Impressions(s) / UC Diagnoses   Final diagnoses:  Nausea vomiting and diarrhea  Dizziness     Discharge Instructions     Take the antinausea medication as directed.    Keep yourself hydrated with clear liquids, such as water, Gatorade, Pedialyte, Sprite, or ginger ale.    Go to the emergency department if you have acute worsening symptoms.    Follow up with your primary care provider if your symptoms are not improving.         ED Prescriptions    Medication Sig Dispense Auth. Provider   ondansetron (ZOFRAN ODT) 4 MG disintegrating tablet Take 1 tablet (4 mg total) by mouth every 8 (eight) hours as needed for nausea or vomiting. 20 tablet Mickie Bail, NP     PDMP not reviewed this encounter.   Mickie Bail, NP 02/06/21 1711

## 2021-02-06 NOTE — ED Provider Notes (Addendum)
[facility]    CSN: [id number] Arrival date & time: 02/06/21  1331      History   Chief Complaint Chief Complaint  Patient presents with   Nausea   Emesis   Diarrhea   Dizziness    HPI Elizabeth Stephens is a 45 y.o. female.   Patient presents with nausea, vomiting, diarrhea, and dizziness x2 days.  Her symptoms started on 02/04/2021 after eating at [facility] fried chicken.  She thinks she has food poisoning.  She denies fever, or other symptoms.  Last emesis yesterday.  5 episodes of diarrhea today.  She states her stool is liquid "like water."  Patient has been able to eat toast today without emesis.  She requests testing for intestinal illnesses such as Salmonella.  She also requests blood work.  No treatments attempted at home.  Her medical history includes gestational diabetes.  The history is provided by the patient and medical records.    Past Medical History:  Diagnosis Date   Gestational diabetes    glyburide    Patient Active Problem List   Diagnosis Date Noted   Gestational diabetes mellitus 10/30/2017   SVD (spontaneous vaginal delivery) 10/30/2017   Impaired glucose tolerance test 08/31/2017   Missed abortion 06/01/2013    Past Surgical History:  Procedure Laterality Date   DILATION AND EVACUATION N/A 06/01/2013   Procedure: DILATATION AND EVACUATION, ultrasound guided;  Surgeon: Dorien Chihuahua. Richardson Dopp, MD;  Location: [facility];  Service: Gynecology;  Laterality: N/A;    OB History    Gravida  6   Para  4   Term  4   Preterm      AB  2   Living  4     SAB  2   IAB      Ectopic      Multiple  0   Live Births  4            Home Medications    Prior to Admission medications   Medication Sig Start Date End Date Taking? Authorizing Provider  ondansetron (ZOFRAN ODT) 4 MG disintegrating tablet Take 1 tablet (4 mg total) by mouth every 8 (eight) hours as needed for nausea or vomiting. 02/06/21  Yes Mickie Bail, NP   Ascorbic Acid (VITAMIN C) 1000 MG tablet Take 1,000 mg by mouth daily.    [provider]  ibuprofen (ADVIL,MOTRIN) 200 MG tablet Take 600-800 mg by mouth every 6 (six) hours as needed for mild pain or cramping.    [provider]  Prenatal Vit-Fe Fumarate-FA (PRENATAL VITAMIN PO) Take 1 tablet by mouth daily.     [provider]    Family History Family History  Problem Relation Age of Onset   Arthritis Mother    Hypertension Father    Diabetes Father     Social History Social History   Tobacco Use   Smoking status: Never Smoker   Smokeless tobacco: Never Used  Substance Use Topics   Alcohol use: No   Drug use: No     Allergies   Patient has no known allergies.   Review of Systems Review of Systems  Constitutional: Negative for chills and fever.  HENT: Negative for ear pain and sore throat.   Eyes: Negative for pain and visual disturbance.  Respiratory: Negative for cough and shortness of breath.   Cardiovascular: Negative for chest pain and palpitations.  Gastrointestinal: Positive for diarrhea, nausea and vomiting. Negative for abdominal pain.  Genitourinary:  Negative for dysuria and hematuria.  Musculoskeletal: Negative for arthralgias and back pain.  Skin: Negative for color change and rash.  Neurological: Positive for dizziness. Negative for syncope, weakness, numbness and headaches.  All other systems reviewed and are negative.    Physical Exam Triage Vital Signs ED Triage Vitals  Enc Vitals Group     BP      Pulse      Resp      Temp      Temp src      SpO2      Weight      Height      Head Circumference      Peak Flow      Pain Score      Pain Loc      Pain Edu?      Excl. in GC?    Orthostatic VS for the past 24 hrs:  BP- Lying Pulse- Lying BP- Sitting Pulse- Sitting BP- Standing at 0 minutes Pulse- Standing at 0 minutes  02/06/21 1651 109/73 98 122/81 102 121/71 113    Updated Vital Signs BP 114/81  (BP Location: Right Arm)    Pulse 100    Temp 98.4 F (36.9 C) (Oral)    Resp 17    LMP 01/16/2021    SpO2 100%   Visual Acuity Right Eye Distance:   Left Eye Distance:   Bilateral Distance:    Right Eye Near:   Left Eye Near:    Bilateral Near:     Physical Exam Vitals and nursing note reviewed.  Constitutional:      General: She is not in acute distress.    Appearance: She is well-developed.  HENT:     Head: Normocephalic and atraumatic.     Mouth/Throat:     Mouth: Mucous membranes are moist.  Eyes:     Conjunctiva/sclera: Conjunctivae normal.  Cardiovascular:     Rate and Rhythm: Normal rate and regular rhythm.     Heart sounds: Normal heart sounds.  Pulmonary:     Effort: Pulmonary effort is normal. No respiratory distress.     Breath sounds: Normal breath sounds.  Abdominal:     General: Bowel sounds are increased. There is no distension.     Palpations: Abdomen is soft.     Tenderness: There is no abdominal tenderness. There is no guarding or rebound.  Musculoskeletal:     Cervical back: Neck supple.  Skin:    General: Skin is warm and dry.  Neurological:     General: No focal deficit present.     Mental Status: She is alert and oriented to person, place, and time.     Gait: Gait normal.  Psychiatric:        Mood and Affect: Mood normal.        Behavior: Behavior normal.      UC Treatments / Results  Labs (all labs ordered are listed, but only abnormal results are displayed) Labs Reviewed  CBC - Abnormal; Notable for the following components:      Result Value   RBC 5.20 (*)    All other components within normal limits  COMPREHENSIVE METABOLIC PANEL - Abnormal; Notable for the following components:   Sodium 134 (*)    Potassium 3.4 (*)    CO2 20 (*)    Glucose, Bld 104 (*)    Total Protein 8.8 (*)    All other components within normal limits  GASTROINTESTINAL PANEL BY PCR, STOOL (REPLACES  STOOL CULTURE)    EKG   Radiology No results  found.  Procedures Procedures (including critical care time)  Medications Ordered in UC Medications  ondansetron (ZOFRAN-ODT) disintegrating tablet 4 mg (4 mg Oral Given 02/06/21 1650)    Initial Impression / Assessment and Plan / UC Course  I have reviewed the triage vital signs and the nursing notes.  Pertinent labs & imaging results that were available during my care of the patient were reviewed by me and considered in my medical decision making (see chart for details).   Nausea, vomiting, diarrhea, dizziness.  Zofran given here and patient able to consume liquids without emesis.  Stool for GI panel pending.  CBC and CMP.  Instructed patient to keep herself hydrated with clear liquids.  Zofran at home as needed for nausea or vomiting.  ED precautions discussed.  Instructed her to follow-up with her PCP if her symptoms are not improving.  She agrees to plan of care.   Final Clinical Impressions(s) / UC Diagnoses   Final diagnoses:  Nausea vomiting and diarrhea  Dizziness     Discharge Instructions     Take the antinausea medication as directed.    Keep yourself hydrated with clear liquids, such as water, Gatorade, Pedialyte, Sprite, or ginger ale.    Go to the emergency department if you have acute worsening symptoms.    Follow up with your primary care provider if your symptoms are not improving.         ED Prescriptions    Medication Sig Dispense Auth. Provider   ondansetron (ZOFRAN ODT) 4 MG disintegrating tablet Take 1 tablet (4 mg total) by mouth every 8 (eight) hours as needed for nausea or vomiting. 20 tablet Mickie Bail, NP     PDMP not reviewed this encounter.   Mickie Bail, NP 02/06/21 1711    Mickie Bail, NP 02/07/21 [phone]

## 2021-02-07 LAB — GASTROINTESTINAL PANEL BY PCR, STOOL (REPLACES STOOL CULTURE)

## 2021-06-19 ENCOUNTER — Other Ambulatory Visit: Payer: Self-pay

## 2021-06-19 MED ORDER — METFORMIN HCL 500 MG PO TABS
ORAL_TABLET | ORAL | 0 refills | Status: AC
  Filled 2021-06-19: qty 90, 90d supply, fill #0

## 2021-06-20 ENCOUNTER — Other Ambulatory Visit: Payer: Self-pay

## 2021-07-25 DIAGNOSIS — E663 Overweight: Secondary | ICD-10-CM | POA: Diagnosis not present

## 2021-07-25 DIAGNOSIS — F439 Reaction to severe stress, unspecified: Secondary | ICD-10-CM | POA: Diagnosis not present

## 2021-07-25 DIAGNOSIS — E119 Type 2 diabetes mellitus without complications: Secondary | ICD-10-CM | POA: Diagnosis not present

## 2021-07-25 DIAGNOSIS — Z833 Family history of diabetes mellitus: Secondary | ICD-10-CM | POA: Diagnosis not present

## 2021-07-25 DIAGNOSIS — Z8632 Personal history of gestational diabetes: Secondary | ICD-10-CM | POA: Diagnosis not present

## 2021-07-25 DIAGNOSIS — I10 Essential (primary) hypertension: Secondary | ICD-10-CM | POA: Diagnosis not present

## 2021-07-25 DIAGNOSIS — Z566 Other physical and mental strain related to work: Secondary | ICD-10-CM | POA: Diagnosis not present

## 2022-01-04 ENCOUNTER — Ambulatory Visit (INDEPENDENT_AMBULATORY_CARE_PROVIDER_SITE_OTHER): Payer: 59

## 2022-01-04 DIAGNOSIS — Z23 Encounter for immunization: Secondary | ICD-10-CM | POA: Diagnosis not present

## 2022-01-04 NOTE — Progress Notes (Signed)
° °  Covid-19 Vaccination Clinic  Name:  TYREONA PANJWANI    MRN: [id number] DOB: 1976/10/24  01/04/2022  Ms. Binford was observed post Covid-19 immunization for 15 minutes without incident. She was provided with Vaccine Information Sheet and instruction to access the V-Safe system.   Ms. Federer was instructed to call 911 with any severe reactions post vaccine: Difficulty breathing  Swelling of face and throat  A fast heartbeat  A bad rash all over body  Dizziness and weakness   Immunizations Administered     Name Date Dose VIS Date Route   Pfizer Covid-19 Vaccine Bivalent Booster 01/04/2022 11:57 AM 0.3 mL 07/24/2021 Intramuscular   Manufacturer: [employer], [employer]   Lot: [id number]   NDC: [phone]

## 2022-01-27 ENCOUNTER — Other Ambulatory Visit: Payer: Self-pay

## 2022-01-27 DIAGNOSIS — Z566 Other physical and mental strain related to work: Secondary | ICD-10-CM | POA: Diagnosis not present

## 2022-01-27 DIAGNOSIS — I10 Essential (primary) hypertension: Secondary | ICD-10-CM | POA: Diagnosis not present

## 2022-01-27 DIAGNOSIS — E663 Overweight: Secondary | ICD-10-CM | POA: Diagnosis not present

## 2022-01-27 DIAGNOSIS — F439 Reaction to severe stress, unspecified: Secondary | ICD-10-CM | POA: Diagnosis not present

## 2022-01-27 DIAGNOSIS — D649 Anemia, unspecified: Secondary | ICD-10-CM | POA: Diagnosis not present

## 2022-01-27 DIAGNOSIS — Z833 Family history of diabetes mellitus: Secondary | ICD-10-CM | POA: Diagnosis not present

## 2022-01-27 DIAGNOSIS — E1169 Type 2 diabetes mellitus with other specified complication: Secondary | ICD-10-CM | POA: Diagnosis not present

## 2022-01-27 DIAGNOSIS — Z8632 Personal history of gestational diabetes: Secondary | ICD-10-CM | POA: Diagnosis not present

## 2022-01-27 MED ORDER — ROSUVASTATIN CALCIUM 20 MG PO TABS
ORAL_TABLET | ORAL | 2 refills | Status: AC
  Filled 2022-01-27: qty 24, 90d supply, fill #0

## 2022-01-27 MED ORDER — OZEMPIC (0.25 OR 0.5 MG/DOSE) 2 MG/1.5ML ~~LOC~~ SOPN
PEN_INJECTOR | SUBCUTANEOUS | 1 refills | Status: DC
Start: 2022-01-27 — End: 2022-05-05
  Filled 2022-01-27: qty 1.5, 42d supply, fill #0
  Filled 2022-05-05: qty 1.5, 42d supply, fill #1

## 2022-02-03 ENCOUNTER — Other Ambulatory Visit (HOSPITAL_COMMUNITY)
Admission: RE | Admit: 2022-02-03 | Discharge: 2022-02-03 | Disposition: A | Discharge status: Home / Self Care | Payer: 59 | Source: Ambulatory Visit | Attending: Obstetrics and Gynecology | Admitting: Obstetrics and Gynecology

## 2022-02-03 DIAGNOSIS — Z3202 Encounter for pregnancy test, result negative: Secondary | ICD-10-CM | POA: Diagnosis not present

## 2022-02-03 DIAGNOSIS — Z01419 Encounter for gynecological examination (general) (routine) without abnormal findings: Secondary | ICD-10-CM | POA: Insufficient documentation

## 2022-02-03 DIAGNOSIS — R232 Flushing: Secondary | ICD-10-CM | POA: Diagnosis not present

## 2022-02-03 DIAGNOSIS — N898 Other specified noninflammatory disorders of vagina: Secondary | ICD-10-CM | POA: Diagnosis not present

## 2022-02-03 DIAGNOSIS — N3941 Urge incontinence: Secondary | ICD-10-CM | POA: Diagnosis not present

## 2022-02-03 DIAGNOSIS — N914 Secondary oligomenorrhea: Secondary | ICD-10-CM | POA: Diagnosis not present

## 2022-02-04 DIAGNOSIS — N3941 Urge incontinence: Secondary | ICD-10-CM | POA: Diagnosis not present

## 2022-02-04 DIAGNOSIS — Z3202 Encounter for pregnancy test, result negative: Secondary | ICD-10-CM | POA: Diagnosis not present

## 2022-02-04 DIAGNOSIS — N914 Secondary oligomenorrhea: Secondary | ICD-10-CM | POA: Diagnosis not present

## 2022-02-04 DIAGNOSIS — I10 Essential (primary) hypertension: Secondary | ICD-10-CM | POA: Diagnosis not present

## 2022-02-04 DIAGNOSIS — E1169 Type 2 diabetes mellitus with other specified complication: Secondary | ICD-10-CM | POA: Diagnosis not present

## 2022-02-07 LAB — CYTOLOGY - PAP
Comment: NEGATIVE
Comment: NEGATIVE
Diagnosis: NEGATIVE
HPV 16: NEGATIVE
HPV 18 / 45: NEGATIVE
High risk HPV: POSITIVE — AB

## 2022-04-23 DIAGNOSIS — N898 Other specified noninflammatory disorders of vagina: Secondary | ICD-10-CM | POA: Diagnosis not present

## 2022-04-23 DIAGNOSIS — Z202 Contact with and (suspected) exposure to infections with a predominantly sexual mode of transmission: Secondary | ICD-10-CM | POA: Diagnosis not present

## 2022-04-23 DIAGNOSIS — N760 Acute vaginitis: Secondary | ICD-10-CM | POA: Diagnosis not present

## 2022-05-05 ENCOUNTER — Other Ambulatory Visit: Payer: Self-pay

## 2022-05-05 MED ORDER — OZEMPIC (0.25 OR 0.5 MG/DOSE) 2 MG/3ML ~~LOC~~ SOPN
PEN_INJECTOR | SUBCUTANEOUS | 0 refills | Status: AC
  Filled 2022-05-05: qty 3, 28d supply, fill #0

## 2022-06-19 ENCOUNTER — Emergency Department (HOSPITAL_BASED_OUTPATIENT_CLINIC_OR_DEPARTMENT_OTHER)
Admission: EM | Admit: 2022-06-19 | Discharge: 2022-06-19 | Disposition: A | Discharge status: Home / Self Care | Payer: 59 | Attending: Emergency Medicine | Admitting: Emergency Medicine

## 2022-06-19 ENCOUNTER — Other Ambulatory Visit: Payer: Self-pay | Admitting: Physician Assistant

## 2022-06-19 ENCOUNTER — Emergency Department (HOSPITAL_BASED_OUTPATIENT_CLINIC_OR_DEPARTMENT_OTHER): Payer: 59

## 2022-06-19 ENCOUNTER — Encounter (HOSPITAL_BASED_OUTPATIENT_CLINIC_OR_DEPARTMENT_OTHER): Payer: Self-pay | Admitting: Emergency Medicine

## 2022-06-19 ENCOUNTER — Other Ambulatory Visit: Payer: Self-pay

## 2022-06-19 DIAGNOSIS — M79604 Pain in right leg: Secondary | ICD-10-CM | POA: Insufficient documentation

## 2022-06-19 DIAGNOSIS — Z79899 Other long term (current) drug therapy: Secondary | ICD-10-CM | POA: Diagnosis not present

## 2022-06-19 DIAGNOSIS — M79661 Pain in right lower leg: Secondary | ICD-10-CM

## 2022-06-19 DIAGNOSIS — I10 Essential (primary) hypertension: Secondary | ICD-10-CM | POA: Diagnosis not present

## 2022-06-19 DIAGNOSIS — M79662 Pain in left lower leg: Secondary | ICD-10-CM

## 2022-06-19 LAB — CBC WITH DIFFERENTIAL/PLATELET
Abs Immature Granulocytes: 0.02 10*3/uL (ref 0.00–0.07)
Basophils Absolute: 0 10*3/uL (ref 0.0–0.1)
Basophils Relative: 0 %
Eosinophils Absolute: 0.1 10*3/uL (ref 0.0–0.5)
Eosinophils Relative: 2 %
HCT: 35.2 % — ABNORMAL LOW (ref 36.0–46.0)
Hemoglobin: 11.6 g/dL — ABNORMAL LOW (ref 12.0–15.0)
Immature Granulocytes: 0 %
Lymphocytes Relative: 35 %
Lymphs Abs: 2.1 10*3/uL (ref 0.7–4.0)
MCH: 26.5 pg (ref 26.0–34.0)
MCHC: 33 g/dL (ref 30.0–36.0)
MCV: 80.4 fL (ref 80.0–100.0)
Monocytes Absolute: 0.5 10*3/uL (ref 0.1–1.0)
Monocytes Relative: 8 %
Neutro Abs: 3.3 10*3/uL (ref 1.7–7.7)
Neutrophils Relative %: 55 %
Platelets: 279 10*3/uL (ref 150–400)
RBC: 4.38 MIL/uL (ref 3.87–5.11)
RDW: 14 % (ref 11.5–15.5)
WBC: 6 10*3/uL (ref 4.0–10.5)
nRBC: 0 % (ref 0.0–0.2)

## 2022-06-19 LAB — COMPREHENSIVE METABOLIC PANEL
ALT: 16 U/L (ref 0–44)
AST: 18 U/L (ref 15–41)
Albumin: 3.9 g/dL (ref 3.5–5.0)
Alkaline Phosphatase: 93 U/L (ref 38–126)
Anion gap: 7 (ref 5–15)
BUN: 14 mg/dL (ref 6–20)
CO2: 25 mmol/L (ref 22–32)
Calcium: 9 mg/dL (ref 8.9–10.3)
Chloride: 106 mmol/L (ref 98–111)
Creatinine, Ser: 0.65 mg/dL (ref 0.44–1.00)
GFR, Estimated: >60 mL/min (ref >60)
Glucose, Bld: 148 mg/dL — ABNORMAL HIGH (ref 70–99)
Potassium: 3.5 mmol/L (ref 3.5–5.1)
Sodium: 138 mmol/L (ref 135–145)
Total Bilirubin: 0.5 mg/dL (ref 0.3–1.2)
Total Protein: 7.7 g/dL (ref 6.5–8.1)

## 2022-06-19 LAB — PREGNANCY, URINE: Preg Test, Ur: NEGATIVE

## 2022-06-19 NOTE — Discharge Instructions (Signed)
Note the work-up today was overall negative for DVT.  I doubt given her symptoms you are risk of peripheral arterial disease at this moment.  Your symptoms are most likely related to muscular strain given area of tenderness as well as mechanisms of reproducibility.  I provided information to set her primary care provider in your discharge papers.  Please not hesitate to return to the emergency department the worrisome signs and symptoms we discussed become apparent.

## 2022-06-19 NOTE — ED Notes (Signed)
Pt states both legs are hurting for about 4 days

## 2022-06-19 NOTE — ED Triage Notes (Signed)
Pt POV reports recent drive to [facility], now c/o BL knee pain, now progressing to lower extremities. Denies any swelling.

## 2022-06-19 NOTE — ED Provider Notes (Signed)
[facility] EMERGENCY DEPARTMENT Provider Note   CSN: [id number] Arrival date & time: 06/19/22  1655     History  Chief Complaint  Patient presents with   Leg Pain    Elizabeth Stephens is a 46 y.o. female.   Leg Pain  46 year old female presents emergency department with complaints of right calf pain. Patient states she recently traveled to [place] this past Friday and noticed bilateral lower extremity swelling after her flight. She elevated her feet, and the swelling went down. Since her flight back, she has noticed R calf pain with a possible palpation of "knots." She has not noticed swelling since Saturday. Denies traumatic mechanism, overexertion, hx of DVT/PE, current hormone use, smoking hx, chest pain, shortness of breath, palpitation, fever. Pain is described as worsening with with calf flexion. She denies worsening with walking longer distances.   Past medical history gestational diabetes,  Home Medications Prior to Admission medications   Medication Sig Start Date End Date Taking? Authorizing Provider  Ascorbic Acid (VITAMIN C) 1000 MG tablet Take 1,000 mg by mouth daily.    [provider]  ibuprofen (ADVIL,MOTRIN) 200 MG tablet Take 600-800 mg by mouth every 6 (six) hours as needed for mild pain or cramping.    [provider]  lisinopril (ZESTRIL) 2.5 MG tablet TAKE 1 TABLET BY MOUTH ONCE DAILY 11/20/20 11/20/21  Ileana Ladd, MD  metFORMIN (GLUCOPHAGE) 500 MG tablet TAKE 1 TABLET BY MOUTH ONCE DAILY WITH A MEAL **must schedule office visit** 06/19/21     ondansetron (ZOFRAN ODT) 4 MG disintegrating tablet Take 1 tablet (4 mg total) by mouth every 8 (eight) hours as needed for nausea or vomiting. 02/06/21   Mickie Bail, NP  Prenatal Vit-Fe Fumarate-FA (PRENATAL VITAMIN PO) Take 1 tablet by mouth daily.     [provider]  rosuvastatin (CRESTOR) 20 MG tablet 1 tablet Orally on Mondays, and Friday 90 days 01/27/22      Semaglutide,0.25 or 0.5MG /DOS, (OZEMPIC, 0.25 OR 0.5 MG/DOSE,) 2 MG/3ML SOPN Inject 0.25mg  subcutaneously once a week for 4 weeks, then 0.5mg  once a week for 2 weeks 01/25/22         Allergies    Patient has no known allergies.    Review of Systems   Review of Systems  All other systems reviewed and are negative.   Physical Exam Updated Vital Signs BP (!) 154/97 (BP Location: Left Arm)   Pulse 96   Resp 18   Ht 5\' 6"  (1.676 m)   Wt 79.4 kg   SpO2 99%   BMI 28.25 kg/m  Physical Exam Vitals and nursing note reviewed.  Constitutional:      General: She is not in acute distress.    Appearance: She is well-developed.  HENT:     Head: Normocephalic and atraumatic.  Eyes:     Conjunctiva/sclera: Conjunctivae normal.  Cardiovascular:     Rate and Rhythm: Normal rate and regular rhythm.     Heart sounds: No murmur heard. Pulmonary:     Effort: Pulmonary effort is normal. No respiratory distress.     Breath sounds: Normal breath sounds.  Abdominal:     Palpations: Abdomen is soft.     Tenderness: There is no abdominal tenderness.  Musculoskeletal:        General: No swelling.     Cervical back: Neck supple.     Right lower leg: No edema.     Left lower leg: No edema.  Legs:     Comments: No overlying skin abnormalities noted. No edema noted bilateral lower extremities. No sensory deficits along major nerve distributions of the right lower extremities.  Muscle strength 5/5 in hips, knee, ankles bilaterally in flexion and extension. Palpable nodule noted in R calf.  Posterior tibial pulses full and intact bilaterally.  Skin:    General: Skin is warm and dry.     Capillary Refill: Capillary refill takes less than 2 seconds.  Neurological:     Mental Status: She is alert.  Psychiatric:        Mood and Affect: Mood normal.     ED Results / Procedures / Treatments   Labs (all labs ordered are listed, but only abnormal results are displayed) Labs Reviewed   COMPREHENSIVE METABOLIC PANEL - Abnormal; Notable for the following components:      Result Value   Glucose, Bld 148 (*)    All other components within normal limits  CBC WITH DIFFERENTIAL/PLATELET - Abnormal; Notable for the following components:   Hemoglobin 11.6 (*)    HCT 35.2 (*)    All other components within normal limits  PREGNANCY, URINE    EKG None  Radiology US Venous Img Lower Right (DVT Study)  Result Date: 06/19/2022 CLINICAL DATA:  Calf pain EXAM: RIGHT LOWER EXTREMITY VENOUS DOPPLER ULTRASOUND TECHNIQUE: Gray-scale sonography with compression, as well as color and duplex ultrasound, were performed to evaluate the deep venous system(s) from the level of the common femoral vein through the popliteal and proximal calf veins. COMPARISON:  None Available. FINDINGS: VENOUS Normal compressibility of the common femoral, superficial femoral, and popliteal veins, as well as the visualized calf veins. Visualized portions of profunda femoral vein and great saphenous vein unremarkable. No filling defects to suggest DVT on grayscale or color Doppler imaging. Doppler waveforms show normal direction of venous flow, normal respiratory plasticity and response to augmentation. Limited views of the contralateral common femoral vein are unremarkable. OTHER None. Limitations: none IMPRESSION: Negative. Electronically Signed   By: Wiliam Ke M.D.   On: 06/19/2022 18:49    Procedures Procedures    Medications Ordered in ED Medications - No data to display  ED Course/ Medical Decision Making/ A&P                           Medical Decision Making Amount and/or Complexity of Data Reviewed Labs: ordered.   This patient presents to the ED for concern of right leg pain, this involves an extensive number of treatment options, and is a complaint that carries with it a high risk of complications and morbidity.  The differential diagnosis includes DVT, PAD, muscular strain, sprain,  osteomyelitis, malignancy, osteoarthritis   Co morbidities that complicate the patient evaluation  See HPI   Additional history obtained:  Additional history obtained from EMR External records from outside source obtained and reviewed including prior GFR from 02/06/2021 indicating greater than 60   Lab Tests:  I Ordered, and personally interpreted labs.  The pertinent results include: CBC with no leukocytosis.  No electrolyte abnormality.  Pregnancy negative.   Imaging Studies ordered:  I ordered imaging studies including DVT study I independently visualized and interpreted imaging which showed negative for DVT I agree with the radiologist interpretation   Cardiac Monitoring: / EKG:  The patient was maintained on a cardiac monitor.  I personally viewed and interpreted the cardiac monitored which showed an underlying rhythm of: Sinus rhythm  Consultations Obtained:  N/a   Problem List / ED Course / Critical interventions / Medication management  Right leg pain Reevaluation of the patient showed that the patient improved I have reviewed the patients home medicines and have made adjustments as needed   Social Determinants of Health:  Denies tobacco, alcohol, illicit drug use   Test / Admission - Considered:  Right calf pain Vitals signs significant for mild hypertension with a blood pressure 154/97.  Recommend close follow-up with PCP regarding elevated blood pressure on the emergency department.. Otherwise within normal range and stable throughout visit. Laboratory/imaging studies significant for: No acute abnormalities Work-up negative for DVT.  Clinical suspicion for said diagnosis is low given lack of lower extremity edema.  Palpable knot like area located on posterior right calf made clinical suspicion for DVT mild.  Doubt PAD given lack of intermittent claudication type symptoms.  Patient symptoms most likely secondary to muscular strain.  Symptomatic therapy  with rest, ice, NSAIDs recommended for said injury.  Reevaluation from PCP in 3 to 5 days recommended. Worrisome signs and symptoms were discussed with the patient, and the patient acknowledged understanding to return to the ED if noticed. Patient was stable upon discharge.         Final Clinical Impression(s) / ED Diagnoses Final diagnoses:  Right leg pain    Rx / DC Orders ED Discharge Orders     None         Peter Garter, [place] 06/19/22 1909    Melene Plan, DO 06/19/22 2019

## 2022-06-30 ENCOUNTER — Other Ambulatory Visit: Payer: Self-pay

## 2022-06-30 DIAGNOSIS — E1169 Type 2 diabetes mellitus with other specified complication: Secondary | ICD-10-CM | POA: Diagnosis not present

## 2022-06-30 DIAGNOSIS — I10 Essential (primary) hypertension: Secondary | ICD-10-CM | POA: Diagnosis not present

## 2022-06-30 MED ORDER — MOUNJARO 5 MG/0.5ML ~~LOC~~ SOAJ
SUBCUTANEOUS | 0 refills | Status: DC
  Filled 2022-06-30: qty 2, 28d supply, fill #0

## 2022-06-30 MED ORDER — LOSARTAN POTASSIUM 25 MG PO TABS
ORAL_TABLET | ORAL | 1 refills | Status: AC
  Filled 2022-06-30: qty 90, 90d supply, fill #0

## 2022-07-01 ENCOUNTER — Other Ambulatory Visit: Payer: Self-pay

## 2022-09-05 ENCOUNTER — Other Ambulatory Visit: Payer: Self-pay

## 2022-09-07 ENCOUNTER — Other Ambulatory Visit: Payer: Self-pay

## 2022-09-08 ENCOUNTER — Other Ambulatory Visit: Payer: Self-pay

## 2022-09-08 MED ORDER — MOUNJARO 5 MG/0.5ML ~~LOC~~ SOAJ
SUBCUTANEOUS | 1 refills | Status: AC
  Filled 2022-09-08 – 2023-02-20 (×2): qty 2, 28d supply, fill #0
  Filled 2023-05-01: qty 2, 28d supply, fill #1

## 2022-09-23 ENCOUNTER — Other Ambulatory Visit: Payer: Self-pay

## 2022-10-08 ENCOUNTER — Other Ambulatory Visit: Payer: Self-pay

## 2022-10-08 MED ORDER — MOUNJARO 5 MG/0.5ML ~~LOC~~ SOAJ
5.0000 mg | SUBCUTANEOUS | 0 refills | Status: AC
  Filled 2022-10-08 – 2022-12-01 (×2): qty 2, 28d supply, fill #0

## 2022-10-21 ENCOUNTER — Other Ambulatory Visit: Payer: Self-pay

## 2022-10-24 ENCOUNTER — Other Ambulatory Visit: Payer: Self-pay

## 2022-10-24 DIAGNOSIS — E1169 Type 2 diabetes mellitus with other specified complication: Secondary | ICD-10-CM | POA: Diagnosis not present

## 2022-10-24 DIAGNOSIS — I10 Essential (primary) hypertension: Secondary | ICD-10-CM | POA: Diagnosis not present

## 2022-10-24 DIAGNOSIS — Z6829 Body mass index (BMI) 29.0-29.9, adult: Secondary | ICD-10-CM | POA: Diagnosis not present

## 2022-10-24 MED ORDER — AMLODIPINE BESYLATE 2.5 MG PO TABS
2.5000 mg | ORAL_TABLET | Freq: Every day | ORAL | 0 refills | Status: DC
  Filled 2022-10-24: qty 30, 30d supply, fill #0

## 2022-10-24 MED ORDER — MOUNJARO 5 MG/0.5ML ~~LOC~~ SOAJ
5.0000 mg | SUBCUTANEOUS | 0 refills | Status: AC
  Filled 2022-10-24: qty 2, 28d supply, fill #0

## 2022-12-01 ENCOUNTER — Other Ambulatory Visit: Payer: Self-pay

## 2022-12-29 ENCOUNTER — Other Ambulatory Visit: Payer: Self-pay

## 2022-12-29 DIAGNOSIS — Z113 Encounter for screening for infections with a predominantly sexual mode of transmission: Secondary | ICD-10-CM | POA: Diagnosis not present

## 2022-12-29 DIAGNOSIS — R109 Unspecified abdominal pain: Secondary | ICD-10-CM | POA: Diagnosis not present

## 2022-12-29 DIAGNOSIS — N898 Other specified noninflammatory disorders of vagina: Secondary | ICD-10-CM | POA: Diagnosis not present

## 2022-12-29 DIAGNOSIS — E1169 Type 2 diabetes mellitus with other specified complication: Secondary | ICD-10-CM | POA: Diagnosis not present

## 2022-12-29 MED ORDER — METRONIDAZOLE 500 MG PO TABS
500.0000 mg | ORAL_TABLET | Freq: Two times a day (BID) | ORAL | 0 refills | Status: DC
  Filled 2022-12-29 – 2023-01-19 (×2): qty 14, 7d supply, fill #0

## 2023-01-09 ENCOUNTER — Other Ambulatory Visit: Payer: Self-pay

## 2023-01-19 ENCOUNTER — Other Ambulatory Visit: Payer: Self-pay

## 2023-01-23 ENCOUNTER — Other Ambulatory Visit: Payer: Self-pay

## 2023-01-23 DIAGNOSIS — I10 Essential (primary) hypertension: Secondary | ICD-10-CM | POA: Diagnosis not present

## 2023-01-23 DIAGNOSIS — E1169 Type 2 diabetes mellitus with other specified complication: Secondary | ICD-10-CM | POA: Diagnosis not present

## 2023-01-23 MED ORDER — MOUNJARO 7.5 MG/0.5ML ~~LOC~~ SOPN
7.5000 mg | PEN_INJECTOR | SUBCUTANEOUS | 0 refills | Status: AC
Start: 2023-01-23 — End: ?
  Filled 2023-02-20 – 2023-03-16 (×3): qty 2, 28d supply, fill #0
  Filled 2023-07-06: qty 2, 28d supply, fill #1

## 2023-01-23 MED ORDER — MOUNJARO 5 MG/0.5ML ~~LOC~~ SOAJ
5.0000 mg | SUBCUTANEOUS | 0 refills | Status: AC
  Filled 2023-01-23: qty 2, 28d supply, fill #0

## 2023-02-10 DIAGNOSIS — Z309 Encounter for contraceptive management, unspecified: Secondary | ICD-10-CM | POA: Diagnosis not present

## 2023-02-10 DIAGNOSIS — N898 Other specified noninflammatory disorders of vagina: Secondary | ICD-10-CM | POA: Diagnosis not present

## 2023-02-10 DIAGNOSIS — Z01419 Encounter for gynecological examination (general) (routine) without abnormal findings: Secondary | ICD-10-CM | POA: Diagnosis not present

## 2023-02-11 ENCOUNTER — Other Ambulatory Visit: Payer: Self-pay

## 2023-02-11 MED ORDER — METRONIDAZOLE 500 MG PO TABS
500.0000 mg | ORAL_TABLET | Freq: Two times a day (BID) | ORAL | 0 refills | Status: AC
  Filled 2023-02-11: qty 14, 7d supply, fill #0

## 2023-02-11 MED ORDER — FLUCONAZOLE 200 MG PO TABS
200.0000 mg | ORAL_TABLET | Freq: Once | ORAL | 0 refills | Status: AC
  Filled 2023-02-11: qty 1, 1d supply, fill #0

## 2023-02-20 ENCOUNTER — Other Ambulatory Visit: Payer: Self-pay

## 2023-02-23 ENCOUNTER — Other Ambulatory Visit: Payer: Self-pay

## 2023-02-23 MED ORDER — AMLODIPINE BESYLATE 2.5 MG PO TABS
2.5000 mg | ORAL_TABLET | Freq: Every day | ORAL | 0 refills | Status: AC
Start: 2023-02-23 — End: ?
  Filled 2023-02-23: qty 30, 30d supply, fill #0

## 2023-03-09 ENCOUNTER — Other Ambulatory Visit: Payer: Self-pay

## 2023-03-16 ENCOUNTER — Other Ambulatory Visit: Payer: Self-pay

## 2023-03-17 ENCOUNTER — Other Ambulatory Visit: Payer: Self-pay

## 2023-04-14 DIAGNOSIS — N898 Other specified noninflammatory disorders of vagina: Secondary | ICD-10-CM | POA: Diagnosis not present

## 2023-04-14 DIAGNOSIS — Z8742 Personal history of other diseases of the female genital tract: Secondary | ICD-10-CM | POA: Diagnosis not present

## 2023-04-15 ENCOUNTER — Other Ambulatory Visit: Payer: Self-pay

## 2023-04-15 MED ORDER — FLUCONAZOLE 150 MG PO TABS
150.0000 mg | ORAL_TABLET | ORAL | 1 refills | Status: AC
  Filled 2023-04-15 – 2023-05-01 (×2): qty 3, 9d supply, fill #0

## 2023-04-22 ENCOUNTER — Other Ambulatory Visit: Payer: Self-pay

## 2023-04-22 MED ORDER — MOXIFLOXACIN HCL 400 MG PO TABS
400.0000 mg | ORAL_TABLET | Freq: Every day | ORAL | 0 refills | Status: AC
  Filled 2023-04-22 – 2023-05-01 (×2): qty 7, 7d supply, fill #0

## 2023-04-22 MED ORDER — DOXYCYCLINE MONOHYDRATE 100 MG PO CAPS
100.0000 mg | ORAL_CAPSULE | Freq: Two times a day (BID) | ORAL | 0 refills | Status: AC
Start: 2023-04-22 — End: ?
  Filled 2023-04-22 – 2023-05-01 (×2): qty 14, 7d supply, fill #0

## 2023-04-27 ENCOUNTER — Other Ambulatory Visit: Payer: Self-pay

## 2023-05-01 ENCOUNTER — Other Ambulatory Visit: Payer: Self-pay

## 2023-07-01 DIAGNOSIS — R87610 Atypical squamous cells of undetermined significance on cytologic smear of cervix (ASC-US): Secondary | ICD-10-CM | POA: Diagnosis not present

## 2023-07-01 DIAGNOSIS — A493 Mycoplasma infection, unspecified site: Secondary | ICD-10-CM | POA: Diagnosis not present

## 2023-07-01 DIAGNOSIS — N898 Other specified noninflammatory disorders of vagina: Secondary | ICD-10-CM | POA: Diagnosis not present

## 2023-07-01 DIAGNOSIS — R87618 Other abnormal cytological findings on specimens from cervix uteri: Secondary | ICD-10-CM | POA: Diagnosis not present

## 2023-07-01 DIAGNOSIS — Z124 Encounter for screening for malignant neoplasm of cervix: Secondary | ICD-10-CM | POA: Diagnosis not present

## 2023-07-01 DIAGNOSIS — Z1151 Encounter for screening for human papillomavirus (HPV): Secondary | ICD-10-CM | POA: Diagnosis not present

## 2023-07-01 DIAGNOSIS — R87810 Cervical high risk human papillomavirus (HPV) DNA test positive: Secondary | ICD-10-CM | POA: Diagnosis not present

## 2023-07-02 ENCOUNTER — Other Ambulatory Visit: Payer: Self-pay

## 2023-07-02 MED ORDER — FLUCONAZOLE 150 MG PO TABS
150.0000 mg | ORAL_TABLET | Freq: Once | ORAL | 0 refills | Status: AC
  Filled 2023-07-02: qty 2, 3d supply, fill #0

## 2023-07-06 ENCOUNTER — Other Ambulatory Visit: Payer: Self-pay

## 2023-07-14 DIAGNOSIS — Z23 Encounter for immunization: Secondary | ICD-10-CM | POA: Diagnosis not present

## 2023-07-15 DIAGNOSIS — A493 Mycoplasma infection, unspecified site: Secondary | ICD-10-CM | POA: Diagnosis not present

## 2023-07-16 ENCOUNTER — Other Ambulatory Visit: Payer: Self-pay

## 2023-07-16 DIAGNOSIS — Z3202 Encounter for pregnancy test, result negative: Secondary | ICD-10-CM | POA: Diagnosis not present

## 2023-07-16 DIAGNOSIS — N870 Mild cervical dysplasia: Secondary | ICD-10-CM | POA: Diagnosis not present

## 2023-07-16 DIAGNOSIS — T8332XA Displacement of intrauterine contraceptive device, initial encounter: Secondary | ICD-10-CM | POA: Diagnosis not present

## 2023-07-17 DIAGNOSIS — Z111 Encounter for screening for respiratory tuberculosis: Secondary | ICD-10-CM | POA: Diagnosis not present

## 2023-07-23 DIAGNOSIS — Z30431 Encounter for routine checking of intrauterine contraceptive device: Secondary | ICD-10-CM | POA: Diagnosis not present

## 2023-07-23 DIAGNOSIS — T8332XD Displacement of intrauterine contraceptive device, subsequent encounter: Secondary | ICD-10-CM | POA: Diagnosis not present

## 2023-08-12 ENCOUNTER — Other Ambulatory Visit: Payer: Self-pay

## 2023-08-12 DIAGNOSIS — E1169 Type 2 diabetes mellitus with other specified complication: Secondary | ICD-10-CM | POA: Diagnosis not present

## 2023-08-12 DIAGNOSIS — D649 Anemia, unspecified: Secondary | ICD-10-CM | POA: Diagnosis not present

## 2023-08-12 DIAGNOSIS — Z0000 Encounter for general adult medical examination without abnormal findings: Secondary | ICD-10-CM | POA: Diagnosis not present

## 2023-08-12 DIAGNOSIS — I10 Essential (primary) hypertension: Secondary | ICD-10-CM | POA: Diagnosis not present

## 2023-08-12 MED ORDER — MOUNJARO 7.5 MG/0.5ML ~~LOC~~ SOAJ
7.5000 mg | SUBCUTANEOUS | 0 refills | Status: AC
  Filled 2023-08-12: qty 2, 28d supply, fill #0

## 2023-08-24 ENCOUNTER — Other Ambulatory Visit: Payer: Self-pay

## 2023-09-07 ENCOUNTER — Other Ambulatory Visit: Payer: Self-pay

## 2023-09-08 ENCOUNTER — Other Ambulatory Visit: Payer: Self-pay

## 2023-09-08 MED ORDER — MOUNJARO 7.5 MG/0.5ML ~~LOC~~ SOAJ
7.5000 mg | SUBCUTANEOUS | 1 refills | Status: AC
  Filled 2023-09-08: qty 2, 28d supply, fill #0
  Filled 2024-01-11: qty 2, 28d supply, fill #1

## 2023-10-01 DIAGNOSIS — R102 Pelvic and perineal pain: Secondary | ICD-10-CM | POA: Diagnosis not present

## 2023-10-01 DIAGNOSIS — R232 Flushing: Secondary | ICD-10-CM | POA: Diagnosis not present

## 2023-10-01 DIAGNOSIS — T8332XD Displacement of intrauterine contraceptive device, subsequent encounter: Secondary | ICD-10-CM | POA: Diagnosis not present

## 2023-10-01 DIAGNOSIS — D259 Leiomyoma of uterus, unspecified: Secondary | ICD-10-CM | POA: Diagnosis not present

## 2023-10-01 DIAGNOSIS — N898 Other specified noninflammatory disorders of vagina: Secondary | ICD-10-CM | POA: Diagnosis not present

## 2024-01-11 ENCOUNTER — Other Ambulatory Visit: Payer: Self-pay

## 2024-02-12 ENCOUNTER — Other Ambulatory Visit: Payer: Self-pay

## 2024-02-12 DIAGNOSIS — I10 Essential (primary) hypertension: Secondary | ICD-10-CM | POA: Diagnosis not present

## 2024-02-12 DIAGNOSIS — E1169 Type 2 diabetes mellitus with other specified complication: Secondary | ICD-10-CM | POA: Diagnosis not present

## 2024-02-12 MED ORDER — TELMISARTAN 20 MG PO TABS
20.0000 mg | ORAL_TABLET | Freq: Every day | ORAL | 0 refills | Status: AC
  Filled 2024-02-12: qty 30, 30d supply, fill #0

## 2024-02-12 MED ORDER — MOUNJARO 7.5 MG/0.5ML ~~LOC~~ SOAJ
7.5000 mg | SUBCUTANEOUS | 1 refills | Status: AC
Start: 2024-02-12 — End: ?
  Filled 2024-02-12: qty 6, 84d supply, fill #0
  Filled 2024-05-02: qty 6, 84d supply, fill #1

## 2024-05-02 ENCOUNTER — Other Ambulatory Visit: Payer: Self-pay

## 2024-05-26 ENCOUNTER — Other Ambulatory Visit (HOSPITAL_COMMUNITY)
Admission: RE | Admit: 2024-05-26 | Discharge: 2024-05-26 | Disposition: A | Discharge status: Home / Self Care | Source: Ambulatory Visit | Attending: Obstetrics and Gynecology | Admitting: Obstetrics and Gynecology

## 2024-05-26 DIAGNOSIS — Z1211 Encounter for screening for malignant neoplasm of colon: Secondary | ICD-10-CM | POA: Diagnosis not present

## 2024-05-26 DIAGNOSIS — Z01419 Encounter for gynecological examination (general) (routine) without abnormal findings: Secondary | ICD-10-CM | POA: Diagnosis not present

## 2024-05-26 DIAGNOSIS — D259 Leiomyoma of uterus, unspecified: Secondary | ICD-10-CM | POA: Diagnosis not present

## 2024-05-26 DIAGNOSIS — R232 Flushing: Secondary | ICD-10-CM | POA: Diagnosis not present

## 2024-05-26 DIAGNOSIS — N870 Mild cervical dysplasia: Secondary | ICD-10-CM | POA: Insufficient documentation

## 2024-05-26 DIAGNOSIS — N898 Other specified noninflammatory disorders of vagina: Secondary | ICD-10-CM | POA: Diagnosis not present

## 2024-05-26 DIAGNOSIS — N915 Oligomenorrhea, unspecified: Secondary | ICD-10-CM | POA: Diagnosis not present

## 2024-05-26 DIAGNOSIS — B977 Papillomavirus as the cause of diseases classified elsewhere: Secondary | ICD-10-CM | POA: Insufficient documentation

## 2024-06-02 LAB — CYTOLOGY - PAP
Comment: NEGATIVE
Comment: NEGATIVE
Comment: NEGATIVE
Diagnosis: UNDETERMINED — AB
HPV 16: NEGATIVE
HPV 18 / 45: POSITIVE — AB
High risk HPV: POSITIVE — AB

## 2024-06-16 ENCOUNTER — Other Ambulatory Visit: Payer: Self-pay | Admitting: Obstetrics and Gynecology

## 2024-06-16 DIAGNOSIS — N870 Mild cervical dysplasia: Secondary | ICD-10-CM | POA: Diagnosis not present

## 2024-06-16 DIAGNOSIS — N72 Inflammatory disease of cervix uteri: Secondary | ICD-10-CM | POA: Diagnosis not present

## 2024-06-16 DIAGNOSIS — Z3202 Encounter for pregnancy test, result negative: Secondary | ICD-10-CM | POA: Diagnosis not present

## 2024-06-16 DIAGNOSIS — N898 Other specified noninflammatory disorders of vagina: Secondary | ICD-10-CM | POA: Diagnosis not present

## 2024-06-20 LAB — SURGICAL PATHOLOGY

## 2024-06-27 ENCOUNTER — Other Ambulatory Visit: Payer: Self-pay

## 2024-06-27 MED ORDER — PEG 3350-KCL-NA BICARB-NACL 420 G PO SOLR
ORAL | 0 refills | Status: AC
  Filled 2024-06-27: qty 4000, 1d supply, fill #0

## 2024-06-28 DIAGNOSIS — K649 Unspecified hemorrhoids: Secondary | ICD-10-CM | POA: Diagnosis not present

## 2024-06-28 DIAGNOSIS — Z1211 Encounter for screening for malignant neoplasm of colon: Secondary | ICD-10-CM | POA: Diagnosis not present

## 2024-06-28 DIAGNOSIS — K5730 Diverticulosis of large intestine without perforation or abscess without bleeding: Secondary | ICD-10-CM | POA: Diagnosis not present

## 2024-09-08 ENCOUNTER — Other Ambulatory Visit: Payer: Self-pay

## 2024-09-08 DIAGNOSIS — E611 Iron deficiency: Secondary | ICD-10-CM | POA: Diagnosis not present

## 2024-09-08 DIAGNOSIS — R232 Flushing: Secondary | ICD-10-CM | POA: Diagnosis not present

## 2024-09-08 DIAGNOSIS — T466X5A Adverse effect of antihyperlipidemic and antiarteriosclerotic drugs, initial encounter: Secondary | ICD-10-CM | POA: Diagnosis not present

## 2024-09-08 DIAGNOSIS — E1169 Type 2 diabetes mellitus with other specified complication: Secondary | ICD-10-CM | POA: Diagnosis not present

## 2024-09-08 DIAGNOSIS — I10 Essential (primary) hypertension: Secondary | ICD-10-CM | POA: Diagnosis not present

## 2024-09-08 DIAGNOSIS — Z0000 Encounter for general adult medical examination without abnormal findings: Secondary | ICD-10-CM | POA: Diagnosis not present

## 2024-09-08 MED ORDER — TELMISARTAN 20 MG PO TABS
20.0000 mg | ORAL_TABLET | Freq: Every day | ORAL | 3 refills | Status: AC
  Filled 2024-09-08 – 2024-09-19 (×2): qty 90, 90d supply, fill #0

## 2024-09-18 ENCOUNTER — Other Ambulatory Visit: Payer: Self-pay

## 2024-09-19 ENCOUNTER — Other Ambulatory Visit: Payer: Self-pay

## 2024-09-19 MED ORDER — MOUNJARO 7.5 MG/0.5ML ~~LOC~~ SOAJ
7.5000 mg | SUBCUTANEOUS | 0 refills | Status: AC
  Filled 2024-09-19 – 2024-12-30 (×2): qty 2, 28d supply, fill #0

## 2024-09-30 ENCOUNTER — Other Ambulatory Visit: Payer: Self-pay

## 2024-10-17 DIAGNOSIS — N898 Other specified noninflammatory disorders of vagina: Secondary | ICD-10-CM | POA: Diagnosis not present

## 2024-10-17 DIAGNOSIS — D259 Leiomyoma of uterus, unspecified: Secondary | ICD-10-CM | POA: Diagnosis not present

## 2024-10-17 DIAGNOSIS — T8339XA Other mechanical complication of intrauterine contraceptive device, initial encounter: Secondary | ICD-10-CM | POA: Diagnosis not present

## 2024-10-27 ENCOUNTER — Other Ambulatory Visit: Payer: Self-pay

## 2024-10-27 MED ORDER — MOUNJARO 2.5 MG/0.5ML ~~LOC~~ SOAJ
2.5000 mg | SUBCUTANEOUS | 0 refills | Status: AC
  Filled 2024-10-27 – 2024-11-07 (×2): qty 2, 28d supply, fill #0

## 2024-11-06 ENCOUNTER — Other Ambulatory Visit: Payer: Self-pay

## 2024-11-07 ENCOUNTER — Other Ambulatory Visit: Payer: Self-pay

## 2024-11-11 ENCOUNTER — Other Ambulatory Visit: Payer: Self-pay

## 2024-12-30 ENCOUNTER — Other Ambulatory Visit: Payer: Self-pay
